# Patient Record
Sex: Male | Born: 1965
Health system: Southern US, Community
[De-identification: ages and names within clinical notes are randomized; demographics above are authoritative.]

## PROBLEM LIST (undated history)

## (undated) DIAGNOSIS — E119 Type 2 diabetes mellitus without complications: Secondary | ICD-10-CM

## (undated) DIAGNOSIS — Z9109 Other allergy status, other than to drugs and biological substances: Secondary | ICD-10-CM

## (undated) DIAGNOSIS — T7840XA Allergy, unspecified, initial encounter: Secondary | ICD-10-CM

## (undated) DIAGNOSIS — I10 Essential (primary) hypertension: Secondary | ICD-10-CM

## (undated) DIAGNOSIS — T884XXA Failed or difficult intubation, initial encounter: Secondary | ICD-10-CM

## (undated) DIAGNOSIS — G4730 Sleep apnea, unspecified: Secondary | ICD-10-CM

## (undated) DIAGNOSIS — J329 Chronic sinusitis, unspecified: Secondary | ICD-10-CM

## (undated) DIAGNOSIS — T8859XA Other complications of anesthesia, initial encounter: Secondary | ICD-10-CM

## (undated) HISTORY — DX: Allergy, unspecified, initial encounter: T78.40XA

## (undated) HISTORY — PX: NO PAST SURGERIES: SHX2092

---

## 1998-09-22 ENCOUNTER — Emergency Department (HOSPITAL_COMMUNITY): Admission: EM | Admit: 1998-09-22 | Discharge: 1998-09-22 | Payer: Self-pay | Admitting: Emergency Medicine

## 1998-11-18 ENCOUNTER — Emergency Department (HOSPITAL_COMMUNITY): Admission: EM | Admit: 1998-11-18 | Discharge: 1998-11-18 | Payer: Self-pay | Admitting: Emergency Medicine

## 1999-02-24 ENCOUNTER — Encounter: Admission: RE | Admit: 1999-02-24 | Discharge: 1999-02-24 | Payer: Self-pay | Admitting: Sports Medicine

## 1999-03-18 ENCOUNTER — Encounter: Admission: RE | Admit: 1999-03-18 | Discharge: 1999-03-18 | Payer: Self-pay | Admitting: Family Medicine

## 2002-01-19 ENCOUNTER — Emergency Department (HOSPITAL_COMMUNITY): Admission: EM | Admit: 2002-01-19 | Discharge: 2002-01-19 | Payer: Self-pay | Admitting: Emergency Medicine

## 2009-05-02 ENCOUNTER — Emergency Department (HOSPITAL_COMMUNITY): Admission: EM | Admit: 2009-05-02 | Discharge: 2009-05-03 | Payer: Self-pay | Admitting: Emergency Medicine

## 2009-12-20 ENCOUNTER — Emergency Department (HOSPITAL_COMMUNITY): Admission: EM | Admit: 2009-12-20 | Discharge: 2009-12-20 | Payer: Self-pay | Admitting: Family Medicine

## 2013-09-28 ENCOUNTER — Emergency Department (HOSPITAL_COMMUNITY)
Admission: EM | Admit: 2013-09-28 | Discharge: 2013-09-28 | Disposition: A | Discharge status: Home / Self Care | Payer: Self-pay | Attending: Emergency Medicine | Admitting: Emergency Medicine

## 2013-09-28 ENCOUNTER — Encounter (HOSPITAL_COMMUNITY): Payer: Self-pay | Admitting: Emergency Medicine

## 2013-09-28 ENCOUNTER — Emergency Department (HOSPITAL_COMMUNITY): Payer: Self-pay

## 2013-09-28 DIAGNOSIS — M549 Dorsalgia, unspecified: Secondary | ICD-10-CM

## 2013-09-28 DIAGNOSIS — J189 Pneumonia, unspecified organism: Secondary | ICD-10-CM

## 2013-09-28 DIAGNOSIS — R059 Cough, unspecified: Secondary | ICD-10-CM

## 2013-09-28 DIAGNOSIS — F17200 Nicotine dependence, unspecified, uncomplicated: Secondary | ICD-10-CM | POA: Insufficient documentation

## 2013-09-28 DIAGNOSIS — J159 Unspecified bacterial pneumonia: Secondary | ICD-10-CM | POA: Insufficient documentation

## 2013-09-28 DIAGNOSIS — R05 Cough: Secondary | ICD-10-CM

## 2013-09-28 DIAGNOSIS — Z792 Long term (current) use of antibiotics: Secondary | ICD-10-CM | POA: Insufficient documentation

## 2013-09-28 HISTORY — DX: Chronic sinusitis, unspecified: J32.9

## 2013-09-28 HISTORY — DX: Other allergy status, other than to drugs and biological substances: Z91.09

## 2013-09-28 MED ORDER — ALBUTEROL SULFATE HFA 108 (90 BASE) MCG/ACT IN AERS
2.0000 | INHALATION_SPRAY | Freq: Once | RESPIRATORY_TRACT | Status: AC
  Administered 2013-09-28: 2 via RESPIRATORY_TRACT
  Filled 2013-09-28: qty 6.7

## 2013-09-28 MED ORDER — AZITHROMYCIN 250 MG PO TABS: 1000.0000 mg | ORAL_TABLET | Freq: Once | ORAL | Status: DC

## 2013-09-28 MED ORDER — AZITHROMYCIN 250 MG PO TABS
500.0000 mg | ORAL_TABLET | Freq: Once | ORAL | Status: AC
  Administered 2013-09-28: 500 mg via ORAL
  Filled 2013-09-28: qty 2

## 2013-09-28 MED ORDER — AZITHROMYCIN 250 MG PO TABS: 250.0000 mg | ORAL_TABLET | Freq: Every day | ORAL | Status: DC

## 2013-09-28 MED ORDER — HYDROCOD POLST-CHLORPHEN POLST 10-8 MG/5ML PO LQCR: 5.0000 mL | Freq: Two times a day (BID) | ORAL | Status: DC | PRN

## 2013-09-28 NOTE — Discharge Instructions (Signed)
Please read and follow all provided instructions.  Your diagnoses today include:  1. Community acquired pneumonia   2. Cough   3. Back pain    Tests performed today include:  Chest x-ray -- shows pneumonia  Vital signs. See below for your results today.   Medications prescribed:   Azithromycin - antibiotic for respiratory infection  You have been prescribed an antibiotic medicine: take the entire course of medicine even if you are feeling better. Stopping early can cause the antibiotic not to work.   Albuterol inhaler - medication that opens up your airway  Use inhaler as follows: 1-2 puffs with spacer every 4 hours as needed for wheezing, cough, or shortness of breath.    Tussinex - narcotic cough suppressant syrup  You have been prescribed narcotic cough suppressant such as Tussinex: DO NOT drive or perform any activities that require you to be awake and alert because this medicine can make you drowsy.   Take any prescribed medications only as directed.  Home care instructions:  Follow any educational materials contained in this packet.  Take the complete course of antibiotics that you were prescribed.   BE VERY CAREFUL not to take multiple medicines containing Tylenol (also called acetaminophen). Doing so can lead to an overdose which can damage your liver and cause liver failure and possibly death.   Follow-up instructions: Please follow-up with your primary care provider in the next 3 days for further evaluation of your symptoms and to ensure resolution of your infection.   If you do not have a primary care doctor -- see below for referral information.   Return instructions:   Please return to the Emergency Department if you experience worsening symptoms.   Return immediately with worsening breathing, worsening shortness of breath, or if you feel it is taking you more effort to breathe.   Please return if you have any other emergent concerns.  Additional  Information:  Your vital signs today were: BP 139/72   Pulse 86   Temp(Src) 98.9 F (37.2 C)   Resp 17   Wt 215 lb (97.523 kg)   SpO2 100% If your blood pressure (BP) was elevated above 135/85 this visit, please have this repeated by your doctor within one month. --------------

## 2013-09-28 NOTE — ED Provider Notes (Signed)
CSN: [id number]     Arrival date & time 09/28/13  1303 History   First MD Initiated Contact with Patient 09/28/13 1312    This chart was scribed for Earmon Phoenix, a non-physician practitioner working with Richarda Blade, MD by Denice Bors, ED Scribe. This patient was seen in room TR07C/TR07C and the patient's care was started at 1:36 PM    Chief Complaint  Patient presents with  . Cough     (Consider location/radiation/quality/duration/timing/severity/associated sxs/prior Treatment) The history is provided by the patient. No language interpreter was used.   HPI Comments: Jorge [name] is a 48 y.o. male who presents to the Emergency Department complaining of persistent productive cough with phelgm onset 3 weeks. 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 Reports associated intermittent left sided back pain with cough, wheeze, mildly bloody nasal discharge, shortness of breath, and rhinorrhea. Reports trying Mucinex, Claritin, Afrin, and Aleve with no relief of symptoms. Reports back pain is exacerbated by cough, and deep breathing. Denies associated fever, and hemoptysis. Reports PMHx seasonal allergies. States he smokes cigarettes. Denies other pertinent PMHx.   Past Medical History  Diagnosis Date  . Environmental allergies   . Sinus infection    History reviewed. No pertinent past surgical history. History reviewed. No pertinent family history. History  Substance Use Topics  . Smoking status: Current Every Day Smoker    Types: Cigarettes  . Smokeless tobacco: Not on file  . Alcohol Use: Yes    Review of Systems  Constitutional: Negative for fever.  HENT: Positive for congestion. Negative for rhinorrhea and sore throat.   Eyes: Negative for redness.  Respiratory: Positive for cough, shortness of breath and wheezing.   Cardiovascular: Negative for chest pain.  Gastrointestinal: Negative for nausea, vomiting, abdominal pain and diarrhea.  Genitourinary: Negative for  dysuria.  Musculoskeletal: Positive for back pain. Negative for myalgias.  Skin: Negative for rash.  Allergic/Immunologic: Positive for environmental allergies.  Neurological: Negative for headaches.  Psychiatric/Behavioral: Negative for confusion.    Allergies  Review of patient's allergies indicates no known allergies.  Home Medications   Current Outpatient Rx  Name  Route  Sig  Dispense  Refill  . azithromycin (ZITHROMAX) 250 MG tablet   Oral   Take 1 tablet (250 mg total) by mouth daily.   4 tablet   0   . chlorpheniramine-HYDROcodone (TUSSIONEX PENNKINETIC ER) 10-8 MG/5ML LQCR   Oral   Take 5 mLs by mouth every 12 (twelve) hours as needed for cough.   115 mL   0    BP 120/68  Pulse 102  Temp(Src) 98.9 F (37.2 C)  Resp 17  Wt 215 lb (97.523 kg)  SpO2 93%  Physical Exam  Nursing note and vitals reviewed. Constitutional: He appears well-developed and well-nourished. No distress.  HENT:  Head: Normocephalic and atraumatic.  Nose: Mucosal edema present.  Mouth/Throat: Uvula is midline, oropharynx is clear and moist and mucous membranes are normal. No trismus in the jaw. No uvula swelling. No oropharyngeal exudate, posterior oropharyngeal edema or posterior oropharyngeal erythema.  Eyes: EOM are normal.  Neck: Neck supple. No tracheal deviation present.  Cardiovascular: Normal rate.   Pulmonary/Chest: Effort normal and breath sounds normal. No respiratory distress. He has no wheezes. He has no rales. He exhibits no tenderness.  Musculoskeletal: Normal range of motion.  Reproducible diffuse left back pain in musculature of back.   No midline C-spine, T-spine, or L-spine tenderness with no step-offs or deformities noted    Neurological:  He is alert.  Skin: Skin is warm and dry.  Psychiatric: He has a normal mood and affect. His behavior is normal.    ED Course  Procedures (including critical care time) COORDINATION OF CARE:  Nursing notes reviewed. Vital  signs reviewed. Initial pt interview and examination performed.   Filed Vitals:   09/28/13 1310  BP: 120/68  Pulse: 102  Temp: 98.9 F (37.2 C)  Resp: 17  Weight: 215 lb (97.523 kg)  SpO2: 93%    1:39 PM-Discussed work up plan with pt at bedside, which includes  Orders Placed This Encounter  Procedures  . DG Chest 2 View    Standing Status: Standing     Number of Occurrences: 1     Standing Expiration Date:     Order Specific Question:  Reason for exam:    Answer:  cough x 3 weeks, SOB  . Pt agrees with plan.   Treatment plan initiated: Medications  albuterol (PROVENTIL HFA;VENTOLIN HFA) 108 (90 BASE) MCG/ACT inhaler 2 puff (2 puffs Inhalation Given 09/28/13 1427)  azithromycin (ZITHROMAX) tablet 500 mg (500 mg Oral Given 09/28/13 1427)   Initial diagnostic testing ordered.    Labs Review Labs Reviewed - No data to display Imaging Review Dg Chest 2 View  09/28/2013   CLINICAL DATA:  Cough for 3 weeks.  EXAM: CHEST  2 VIEW  COMPARISON:  None.  FINDINGS: Heart size is normal. There is density at the right lung base raising the question of infectious infiltrate. No pulmonary edema. Visualized osseous structures have a normal appearance.  IMPRESSION: Question of right lower lobe infiltrate. Followup is recommended to document clearing.   Electronically Signed   By: Shon Hale M.D.   On: 09/28/2013 14:00     EKG Interpretation None      2:27 PM Patient seen and examined. Work-up initiated. Medications ordered.   Vital signs reviewed and are as follows: Filed Vitals:   09/28/13 1321  BP: 139/72  Pulse: 86  Temp:   Resp:   BP 139/72  Pulse 86  Temp(Src) 98.9 F (37.2 C)  Resp 17  Wt 215 lb (97.523 kg)  SpO2 100%  Pt informed of x-ray findings. Infiltrate noted. Azithro and albuterol HFA given in ED. Will d/c to home with azithro and Tussionex.   Encouraged PCP f/u to ensure resolution.   Told to return with high fever, worsening SOB, trouble breathing, severe  pain, other concerns.   Patient counseled on use of narcotic cough medications. Counseled not to combine these medications with others containing tylenol. Urged not to drink alcohol, drive, or perform any other activities that requires focus while taking these medications. The patient verbalizes understanding and agrees with the plan.   MDM   Final diagnoses:  Community acquired pneumonia  Cough  Back pain   Patient with cough x 3 weeks: ? Viral syndrome followed by bacterial infection. No resp distress. No fever. He looks well. Do not suspect PE. Without fever and murmur do not suspect endocarditis. No multilobar infiltrate, no peripheral signs. No effusion concerning for empyema. No indication for chest CT at this time.   Suspect L back pain is due to muscular tenderness from coughing.   I personally performed the services described in this documentation, which was scribed in my presence. The recorded information has been reviewed and is accurate.     Carlisle Cater, PA-C 09/28/13 1704

## 2013-09-28 NOTE — ED Notes (Signed)
Pt states hes had seasonal allergies and a cough for the past 3 weeks. States his cough will not go away and now his back hurts every time he coughs.

## 2013-10-01 ENCOUNTER — Encounter (HOSPITAL_COMMUNITY): Payer: Self-pay | Admitting: Emergency Medicine

## 2013-10-01 ENCOUNTER — Emergency Department (HOSPITAL_COMMUNITY)
Admission: EM | Admit: 2013-10-01 | Discharge: 2013-10-01 | Disposition: A | Discharge status: Home / Self Care | Payer: Self-pay | Attending: Emergency Medicine | Admitting: Emergency Medicine

## 2013-10-01 ENCOUNTER — Emergency Department (HOSPITAL_COMMUNITY): Payer: Self-pay

## 2013-10-01 DIAGNOSIS — R059 Cough, unspecified: Secondary | ICD-10-CM | POA: Insufficient documentation

## 2013-10-01 DIAGNOSIS — Y9389 Activity, other specified: Secondary | ICD-10-CM | POA: Insufficient documentation

## 2013-10-01 DIAGNOSIS — F17200 Nicotine dependence, unspecified, uncomplicated: Secondary | ICD-10-CM | POA: Insufficient documentation

## 2013-10-01 DIAGNOSIS — R05 Cough: Secondary | ICD-10-CM | POA: Insufficient documentation

## 2013-10-01 DIAGNOSIS — Y929 Unspecified place or not applicable: Secondary | ICD-10-CM | POA: Insufficient documentation

## 2013-10-01 DIAGNOSIS — S2239XA Fracture of one rib, unspecified side, initial encounter for closed fracture: Secondary | ICD-10-CM | POA: Insufficient documentation

## 2013-10-01 DIAGNOSIS — Z79899 Other long term (current) drug therapy: Secondary | ICD-10-CM | POA: Insufficient documentation

## 2013-10-01 DIAGNOSIS — Z791 Long term (current) use of non-steroidal anti-inflammatories (NSAID): Secondary | ICD-10-CM | POA: Insufficient documentation

## 2013-10-01 DIAGNOSIS — Z8709 Personal history of other diseases of the respiratory system: Secondary | ICD-10-CM | POA: Insufficient documentation

## 2013-10-01 DIAGNOSIS — X58XXXA Exposure to other specified factors, initial encounter: Secondary | ICD-10-CM | POA: Insufficient documentation

## 2013-10-01 MED ORDER — GUAIFENESIN 100 MG/5ML PO SYRP: 100.0000 mg | ORAL_SOLUTION | ORAL | Status: DC | PRN

## 2013-10-01 MED ORDER — OXYCODONE-ACETAMINOPHEN 5-325 MG PO TABS
1.0000 | ORAL_TABLET | ORAL | Status: DC | PRN
Start: 2013-10-01 — End: 2016-08-12

## 2013-10-01 NOTE — ED Notes (Signed)
MD at bedside. 

## 2013-10-01 NOTE — ED Provider Notes (Signed)
I saw and evaluated the patient, reviewed the resident's note and I agree with the findings and plan.   EKG Interpretation None      No results found for this or any previous visit. Dg Chest 2 View  09/28/2013   CLINICAL DATA:  Cough for 3 weeks.  EXAM: CHEST  2 VIEW  COMPARISON:  None.  FINDINGS: Heart size is normal. There is density at the right lung base raising the question of infectious infiltrate. No pulmonary edema. Visualized osseous structures have a normal appearance.  IMPRESSION: Question of right lower lobe infiltrate. Followup is recommended to document clearing.   Electronically Signed   By: Shon Hale M.D.   On: 09/28/2013 14:00   Dg Ribs Unilateral W/chest Left  10/01/2013   CLINICAL DATA:  Coughing.  Pneumonia.  EXAM: LEFT RIBS AND CHEST - 3+ VIEW  COMPARISON:  DG CHEST 2 VIEW dated 09/28/2013  FINDINGS: There is a new small left pleural effusion. Opacity at the left lung base may represent compressive atelectasis however airspace disease cannot be excluded. Patchy opacity at the right lung base appears similar, which may represent treated pneumonia or post infectious/ inflammatory changes.  There is a displaced Left tenth rib fracture.  No pneumothorax.  IMPRESSION: 1. Displaced left tenth rib fracture. No pneumothorax. Small left pleural effusion and left basilar density likely representing atelectasis. 2. Little change in patchy airspace density at the right base, which may represent treated pneumonia.   Electronically Signed   By: Dereck Ligas M.D.   On: 10/01/2013 13:57    Patient seen by me. Agree with the resident's plan. Patient diagnosed with pneumonia finished course of the Zithromax today. Chest x-ray today shows improvement in the pneumonia however he has best we can tell new left 10th rib fracture and that's where most of his pain is it is on the left side it is posterior. Patient in no acute distress. Patient is pain control. Patient given per cautioned Z. if fevers  recur shortness of breath gets much worse to come on back. Suspect a rib fracture occurred from coughing. Because is no history of trauma.     Mervin Kung, MD 10/01/13 [phone]

## 2013-10-01 NOTE — ED Notes (Signed)
Was dx w/ pneu last week now he feels like  His  hurts when he coughs and hurts his back . Took last antibiotic today  Has  48 yo w/ him, drove himself to hospital

## 2013-10-01 NOTE — ED Provider Notes (Signed)
CSN: [id number]     Arrival date & time 10/01/13  1309 History   None    No chief complaint on file.  HPI Jorge [name] is a 48 y.o. male presents complaining of rib pain. Cough for 3-4 weeks.  Recently treated with azithromycin for PNA.  Improving, but still with cough.  No sputum production now.  No fever now.  No systemic symtpoms.  Now with worse pain from coughing in his abdomen and left posterolateral chest wall.  Pain is severe.  He cannot afford cough medicine previously prescribed.  Symptoms were initially relieved by cough medicine.  Partially relieved by Motrin.  No frontal chest pain, shorntness of breath, hemoptysis, leg swelling, or other symptoms.  Past Medical History  Diagnosis Date  . Environmental allergies   . Sinus infection    History reviewed. No pertinent past surgical history. No family history on file. History  Substance Use Topics  . Smoking status: Current Every Day Smoker    Types: Cigarettes  . Smokeless tobacco: Not on file  . Alcohol Use: Yes    Review of Systems  Constitutional: Negative for fever and chills.  HENT: Negative for congestion and rhinorrhea.   Eyes: Negative for visual disturbance.  Respiratory: Positive for cough. Negative for shortness of breath.   Cardiovascular: Negative for chest pain and leg swelling.  Gastrointestinal: Negative for nausea, vomiting, abdominal pain and diarrhea.  Genitourinary: Negative for dysuria, urgency, frequency, flank pain and difficulty urinating.  Musculoskeletal: Negative for back pain, neck pain and neck stiffness.  Skin: Negative for [name].  Neurological: Negative for syncope, weakness, numbness and headaches.  All other systems reviewed and are negative.     Allergies  Review of patient's allergies indicates no known allergies.  Home Medications   Current Outpatient Rx  Name  Route  Sig  Dispense  Refill  . albuterol (PROVENTIL HFA;VENTOLIN HFA) 108 (90 BASE) MCG/ACT inhaler    Inhalation   Inhale 2 puffs into the lungs every 6 (six) hours as needed for wheezing or shortness of breath.         Marland Kitchen azithromycin (ZITHROMAX) 250 MG tablet   Oral   Take 250 mg by mouth daily. For 3 days. Finished on 10-01-13         . chlorpheniramine-HYDROcodone (TUSSIONEX PENNKINETIC ER) 10-8 MG/5ML LQCR   Oral   Take 5 mLs by mouth every 12 (twelve) hours as needed for cough.   115 mL   0   . naproxen sodium (ALEVE) 220 MG tablet   Oral   Take 220 mg by mouth 2 (two) times daily with a meal.         . guaifenesin (ROBITUSSIN) 100 MG/5ML syrup   Oral   Take 5-10 mLs (100-200 mg total) by mouth every 4 (four) hours as needed for cough.   60 mL   0   . oxyCODONE-acetaminophen (PERCOCET/ROXICET) 5-325 MG per tablet   Oral   Take 1-2 tablets by mouth every 4 (four) hours as needed for severe pain.   30 tablet   0    BP 151/89  Pulse 74  Temp(Src) 97.8 F (36.6 C) (Oral)  Resp 16  SpO2 98% Physical Exam  Nursing note and vitals reviewed. Constitutional: He is oriented to person, place, and time. He appears well-developed and well-nourished. No distress.  Looks well, but winces with cough  HENT:  Head: Normocephalic and atraumatic.  Mouth/Throat: Oropharynx is clear and moist.  Eyes: Conjunctivae and EOM are  normal. Pupils are equal, round, and reactive to light. No scleral icterus.  Neck: Normal range of motion. Neck supple. No JVD present. No tracheal deviation present.  Cardiovascular: Normal rate, regular rhythm, normal heart sounds and intact distal pulses.  Exam reveals no gallop and no friction rub.   No murmur heard. Pulmonary/Chest: Effort normal and breath sounds normal. No stridor. No respiratory distress. He has no wheezes. He has no rales. He exhibits tenderness (tenderness to palpation bilaterally over lower costal margins and over left posteralateral chesst wall focally).  Abdominal: Soft. He exhibits no distension and no mass. There is no  tenderness. There is no rebound and no guarding.  Musculoskeletal: He exhibits no edema.  Neurological: He is alert and oriented to person, place, and time. No cranial nerve deficit. He exhibits normal muscle tone.  Skin: Skin is warm. No [name] noted. He is not diaphoretic.  Psychiatric: He has a normal mood and affect.    ED Course  Procedures (including critical care time) Labs Review Labs Reviewed  BORDETELLA PERTUSSIS PCR   Imaging Review Dg Ribs Unilateral W/chest Left  10/01/2013   CLINICAL DATA:  Coughing.  Pneumonia.  EXAM: LEFT RIBS AND CHEST - 3+ VIEW  COMPARISON:  DG CHEST 2 VIEW dated 09/28/2013  FINDINGS: There is a new small left pleural effusion. Opacity at the left lung base may represent compressive atelectasis however airspace disease cannot be excluded. Patchy opacity at the right lung base appears similar, which may represent treated pneumonia or post infectious/ inflammatory changes.  There is a displaced Left tenth rib fracture.  No pneumothorax.  IMPRESSION: 1. Displaced left tenth rib fracture. No pneumothorax. Small left pleural effusion and left basilar density likely representing atelectasis. 2. Little change in patchy airspace density at the right base, which may represent treated pneumonia.   Electronically Signed   Deirdre Peer and and and a Dereck Ligas M.D.   On: 10/01/2013 13:57     EKG Interpretation None      MDM   48 yo M with 3 weeks of cough.  Recently treated with azithromycin for PNA.  Improved.  Now with no fever, chills, or sputum; residual cough, but better.  Significant pain with cough, particularly lower left chest wall.  XR demonstrates broken rib.  I do not suspect PE.  This is non-cardiac pain.  Plan: - Percocet for broken rib - incentive spirometer - robitussin - test for pertussis - narcotic precautions - work note - do not think PE is likely; testing not warranted; we have a clear explanation - chest clear; long time smoker, but no  evidence of bronchospasm; continue albuterol prn  Final diagnoses:  Cough  Broken rib       Wendall Papa, MD 10/03/13 1330

## 2013-10-01 NOTE — ED Notes (Signed)
Family at bedside. 

## 2013-10-04 NOTE — ED Provider Notes (Signed)
Medical screening examination/treatment/procedure(s) were performed by non-physician practitioner and as supervising physician I was immediately available for consultation/collaboration.  Richarda Blade, MD 10/04/13 [phone]

## 2014-10-01 ENCOUNTER — Emergency Department (HOSPITAL_COMMUNITY)
Admission: EM | Admit: 2014-10-01 | Discharge: 2014-10-01 | Disposition: A | Discharge status: Home / Self Care | Payer: Self-pay | Attending: Emergency Medicine | Admitting: Emergency Medicine

## 2014-10-01 ENCOUNTER — Encounter (HOSPITAL_COMMUNITY): Payer: Self-pay

## 2014-10-01 ENCOUNTER — Emergency Department (HOSPITAL_COMMUNITY): Payer: Self-pay

## 2014-10-01 DIAGNOSIS — J209 Acute bronchitis, unspecified: Secondary | ICD-10-CM | POA: Insufficient documentation

## 2014-10-01 DIAGNOSIS — Z720 Tobacco use: Secondary | ICD-10-CM | POA: Insufficient documentation

## 2014-10-01 DIAGNOSIS — Z791 Long term (current) use of non-steroidal anti-inflammatories (NSAID): Secondary | ICD-10-CM | POA: Insufficient documentation

## 2014-10-01 DIAGNOSIS — Z79899 Other long term (current) drug therapy: Secondary | ICD-10-CM | POA: Insufficient documentation

## 2014-10-01 LAB — CBC WITH DIFFERENTIAL/PLATELET
Basophils Absolute: 0 10*3/uL (ref 0.0–0.1)
Basophils Relative: 0 % (ref 0–1)
Eosinophils Absolute: 0.2 10*3/uL (ref 0.0–0.7)
Eosinophils Relative: 2 % (ref 0–5)
HCT: 42.1 % (ref 39.0–52.0)
Hemoglobin: 13.7 g/dL (ref 13.0–17.0)
Lymphocytes Relative: 26 % (ref 12–46)
Lymphs Abs: 3.6 10*3/uL (ref 0.7–4.0)
MCH: 29.6 pg (ref 26.0–34.0)
MCHC: 32.5 g/dL (ref 30.0–36.0)
MCV: 90.9 fL (ref 78.0–100.0)
Monocytes Absolute: 1 10*3/uL (ref 0.1–1.0)
Monocytes Relative: 8 % (ref 3–12)
Neutro Abs: 8.8 10*3/uL — ABNORMAL HIGH (ref 1.7–7.7)
Neutrophils Relative %: 64 % (ref 43–77)
Platelets: 365 10*3/uL (ref 150–400)
RBC: 4.63 MIL/uL (ref 4.22–5.81)
RDW: 15 % (ref 11.5–15.5)
WBC: 13.6 10*3/uL — ABNORMAL HIGH (ref 4.0–10.5)

## 2014-10-01 LAB — BASIC METABOLIC PANEL
Anion gap: 7 (ref 5–15)
BUN: 9 mg/dL (ref 6–23)
CO2: 29 mmol/L (ref 19–32)
Calcium: 9.2 mg/dL (ref 8.4–10.5)
Chloride: 106 mmol/L (ref 96–112)
Creatinine, Ser: 1.19 mg/dL (ref 0.50–1.35)
GFR calc Af Amer: 82 mL/min — ABNORMAL LOW (ref >90)
GFR calc non Af Amer: 71 mL/min — ABNORMAL LOW (ref >90)
Glucose, Bld: 117 mg/dL — ABNORMAL HIGH (ref 70–99)
Potassium: 3.9 mmol/L (ref 3.5–5.1)
Sodium: 142 mmol/L (ref 135–145)

## 2014-10-01 LAB — I-STAT TROPONIN, ED: Troponin i, poc: 0 ng/mL (ref 0.00–0.08)

## 2014-10-01 LAB — D-DIMER, QUANTITATIVE (NOT AT ARMC): D-Dimer, Quant: 0.27 ug/mL-FEU (ref 0.00–0.48)

## 2014-10-01 MED ORDER — HYDROCODONE-HOMATROPINE 5-1.5 MG/5ML PO SYRP
5.0000 mL | ORAL_SOLUTION | Freq: Once | ORAL | Status: AC
  Administered 2014-10-01: 5 mL via ORAL
  Filled 2014-10-01: qty 5

## 2014-10-01 MED ORDER — HYDROCODONE-HOMATROPINE 5-1.5 MG/5ML PO SYRP: 5.0000 mL | ORAL_SOLUTION | Freq: Four times a day (QID) | ORAL | Status: DC | PRN

## 2014-10-01 MED ORDER — IPRATROPIUM-ALBUTEROL 0.5-2.5 (3) MG/3ML IN SOLN
3.0000 mL | Freq: Once | RESPIRATORY_TRACT | Status: AC
  Administered 2014-10-01: 3 mL via RESPIRATORY_TRACT
  Filled 2014-10-01: qty 3

## 2014-10-01 MED ORDER — AZITHROMYCIN 250 MG PO TABS: 250.0000 mg | ORAL_TABLET | Freq: Every day | ORAL | Status: DC

## 2014-10-01 MED ORDER — ALBUTEROL SULFATE HFA 108 (90 BASE) MCG/ACT IN AERS: 1.0000 | INHALATION_SPRAY | Freq: Four times a day (QID) | RESPIRATORY_TRACT | Status: DC | PRN

## 2014-10-01 NOTE — ED Notes (Signed)
Onset 2 months cough and left side and back pain when coughing.  Talking in complete sentences, unlabored breathing.  Pt has been taking Mucinex, Tylenol cold and flu, Nyquil with little relief.

## 2014-10-01 NOTE — ED Notes (Signed)
Pt comfortable with discharge and follow up instructions. Pt declines wheelchair, escorted to waiting area by this RN.

## 2014-10-01 NOTE — Discharge Instructions (Signed)
Acute Bronchitis Bronchitis is inflammation of the airways that extend from the windpipe into the lungs (bronchi). The inflammation often causes mucus to develop. This leads to a cough, which is the most common symptom of bronchitis.  In acute bronchitis, the condition usually develops suddenly and goes away over time, usually in a couple weeks. Smoking, allergies, and asthma can make bronchitis worse. Repeated episodes of bronchitis may cause further lung problems.  CAUSES Acute bronchitis is most often caused by the same virus that causes a cold. The virus can spread from person to person (contagious) through coughing, sneezing, and touching contaminated objects. SIGNS AND SYMPTOMS   Cough.   Fever.   Coughing up mucus.   Body aches.   Chest congestion.   Chills.   Shortness of breath.   Sore throat.  DIAGNOSIS  Acute bronchitis is usually diagnosed through a physical exam. Your health care provider will also ask you questions about your medical history. Tests, such as chest X-rays, are sometimes done to rule out other conditions.  TREATMENT  Acute bronchitis usually goes away in a couple weeks. Oftentimes, no medical treatment is necessary. Medicines are sometimes given for relief of fever or cough. Antibiotic medicines are usually not needed but may be prescribed in certain situations. In some cases, an inhaler may be recommended to help reduce shortness of breath and control the cough. A cool mist vaporizer may also be used to help thin bronchial secretions and make it easier to clear the chest.  HOME CARE INSTRUCTIONS  Get plenty of rest.   Drink enough fluids to keep your urine clear or pale yellow (unless you have a medical condition that requires fluid restriction). Increasing fluids may help thin your respiratory secretions (sputum) and reduce chest congestion, and it will prevent dehydration.   Take medicines only as directed by your health care provider.  If  you were prescribed an antibiotic medicine, finish it all even if you start to feel better.  Avoid smoking and secondhand smoke. Exposure to cigarette smoke or irritating chemicals will make bronchitis worse. If you are a smoker, consider using nicotine gum or skin patches to help control withdrawal symptoms. Quitting smoking will help your lungs heal faster.   Reduce the chances of another bout of acute bronchitis by washing your hands frequently, avoiding people with cold symptoms, and trying not to touch your hands to your mouth, nose, or eyes.   Keep all follow-up visits as directed by your health care provider.  SEEK MEDICAL CARE IF: Your symptoms do not improve after 1 week of treatment.  SEEK IMMEDIATE MEDICAL CARE IF:  You develop an increased fever or chills.   You have chest pain.   You have severe shortness of breath.  You have bloody sputum.   You develop dehydration.  You faint or repeatedly feel like you are going to pass out.  You develop repeated vomiting.  You develop a severe headache. MAKE SURE YOU:   Understand these instructions.  Will watch your condition.  Will get help right away if you are not doing well or get worse. Document Released: 07/22/2004 Document Revised: 10/29/2013 Document Reviewed: 12/05/2012 [facility] Patient Information 2015 [place], [place]. This information is not intended to replace advice given to you by your health care provider. Make sure you discuss any questions you have with your health care provider.   Emergency Department Resource Guide 1) Find a Doctor and Pay Out of Pocket Although you won't have to find out who is  covered by your insurance plan, it is a good idea to ask around and get recommendations. You will then need to call the office and see if the doctor you have chosen will accept you as a new patient and what types of options they offer for patients who are self-pay. Some doctors offer discounts or will set up  payment plans for their patients who do not have insurance, but you will need to ask so you aren't surprised when you get to your appointment.  2) Contact Your Local Health Department Not all health departments have doctors that can see patients for sick visits, but many do, so it is worth a call to see if yours does. If you don't know where your local health department is, you can check in your phone book. The CDC also has a tool to help you locate your state's health department, and many state websites also have listings of all of their local health departments.  3) Find a [facility] If your illness is not likely to be very severe or complicated, you may want to try a walk in clinic. These are popping up all over the country in pharmacies, drugstores, and shopping centers. They're usually staffed by nurse practitioners or physician assistants that have been trained to treat common illnesses and complaints. They're usually fairly quick and inexpensive. However, if you have serious medical issues or chronic medical problems, these are probably not your best option.  No Primary Care Doctor: - Call Health Connect at  [phone] - they can help you locate a primary care doctor that  accepts your insurance, provides certain services, etc. - Physician Referral Service- [phone]  Chronic Pain Problems: Organization         Address  Phone   Notes  [facility]  [phone] Patients need to be referred by their primary care doctor.   Medication Assistance: Organization         Address  Phone   Notes  [facility] Medication [facility] [address] [place], [place] [address] [phone] --Must be a resident of [facility] -- Must have NO insurance coverage whatsoever (no Medicaid/ Medicare, etc.) -- The pt. MUST have a primary care doctor that directs their care regularly and follows them in the community   Med[facility]ist  [phone]   [employer]  [phone]    Agencies that provide inexpensive medical care: Organization         Address  Phone   Notes  [place]  [phone]   [facility]    [phone]   [facility] [place], [place] [address] [phone]   [place] [address] [place] [phone]   Planned Parenthood    [phone]   [facility]    [phone]   [place] and [place] [address], [place] Phone:  [phone], Fax:  [phone] Hours of Operation:  9 am - 6 pm, M-F.  Also accepts Medicaid/Medicare and self-pay.  [facility] for [place], [address], [place] Phone: [phone], Fax: [phone]. Hours of Operation:  8:30 am - 5:30 pm, M-F.  Also accepts Medicaid and self-pay.  [facility] [place] [address], [place] Phone: [phone]   [place], [place], [place] [phone], Ext.  123 Mondays & Thursdays: 7-9 AM.  First 15 patients are seen on a first come, first serve basis.    [place] Providers:  Organization         Address  Phone   Notes  [facility] [address], [place] [phone] Also accepts self-pay patients.  [facility] [address], [place]  [phone]   [place], [address], [place] [phone]   [facility] [address], [place] [phone]   Lucianne Lei [address], [place]   [phone] Only accepts [place] Access [place] patients after they have their name applied to their card.   Self-Pay (no insurance) in [facility]:  Organization         Address  Phone   Notes  Sickle Cell Patients, [facility] [place]  [phone]   [facility] [place] [phone]   [facility] [place]  [place], [place], [place] [phone]   [facility]/Dr. Osei-Bonsu  [address] [place] or [address], [place] [phone] Phone number for both [place] and [place] locations is the same.  Urgent Medical and [facility] [address] [place] [phone]   [facility] [address], [place] or [address] [phone]   [facility] [address] [place] [phone], phone; [phone], fax Sees patients 1st and 3rd Saturday of every month.  Must not qualify for public or private insurance (i.e. Medicaid, Medicare, [place] Health Choice, Veterans' Benefits)  Household income should be no more than 200% of the poverty level The clinic cannot treat you if you are pregnant or think you are pregnant  Sexually transmitted diseases are not treated at the clinic.    Dental Care: Organization         Address  Phone  Notes  [facility] [place] [facility] Department of [facility] [phone] Accepts children up to age 47 who are enrolled in [place] or [place]; pregnant women with a Medicaid card; and children who have applied for Medicaid or [place] Health Choice, but were declined, whose parents can pay a reduced fee at time of service.  [facility] Department of [facility]  [address], [place] [phone] Accepts children up to age 70 who are enrolled in [place] or [place]; pregnant women with a Medicaid card; and children who have applied for Medicaid or [place] Health Choice, but were declined, whose parents can pay a reduced fee at time of service.  [place] Adult Dental Access PROGRAM  [place] [phone] Patients  are seen by appointment only. Walk-ins are not accepted. [place] will see patients 47 years of age and older. Monday - Tuesday (8am-5pm) Most Wednesdays (8:30-5pm) $30 per visit, cash only  [facility] Adult Dental Access PROGRAM  [address], [facility] [phone] Patients are seen by appointment only. Walk-ins are not accepted. [place] will see patients 16 years of age and older. One Wednesday Evening (Monthly: Volunteer Based).  $30 per visit, cash only  [place]  [phone] for  adults; Children under age 74, call Graduate Pediatric Dentistry at [phone]. Children aged 20-14, please call [phone] to request a pediatric application.  Dental services are provided in all areas of dental care including fillings, crowns and bridges, complete and partial dentures, implants, gum treatment, root canals, and extractions. Preventive care is also provided. Treatment is provided to both adults and children. Patients are selected via a lottery and there is often a waiting list.   [facility] [address], [place]  [phone] [email or web address]   [facility] [address] [place], [place] [phone], Ext. [phone] Second and Fourth Thursday of each month, opens at 6:30 AM; Clinic ends at 9 AM.  Patients are seen on a first-come first-served basis, and a limited number are seen during each clinic.   [facility]  [address] Hillard Danker [place], [place] [phone]   Eligibility Requirements You must have lived in [place], [place], or [place] counties for at least the last three months.   You cannot be eligible for state or federal sponsored [employer], including [employer], [place], or [employer].   You generally cannot be eligible for healthcare insurance through your employer.    How to apply: Eligibility screenings are held every Tuesday and Wednesday afternoon from 1:00 pm until  4:00 pm. You do not need an appointment for the interview!  [facility] [address] [place], [place]   [place]  Seminary Department  [place]  [phone]    Behavioral Health Resources in the Community: Intensive Outpatient Programs Organization         Address  Phone  Notes  [place]. [address], [place], [place] [phone]   [facility] Outpatient [address], [place], [place]   ADS: Alcohol & Drug Svcs [address], [place], [place]   [place] [address],  [place], [place] or [phone]   Substance Abuse Resources Organization         Address  Phone  Notes  Alcohol and Drug Services  [phone]   [place]  [phone]   [facility]   Chinita Pester  [phone]   Residential & Outpatient Substance Abuse Program  [phone]   Psychological Services Organization         Address  Phone  Notes  [facility] [place]  [place]  [phone]   [place] [address] [place] or [phone]    Mobile Crisis Teams Organization         Address  Phone  Notes  [facility], Mobile Crisis Care Unit  [phone]   [facility]  [address] [place], [place]   [place] Levels [address] [place] [phone]    Self-Help/Support Groups Organization         Address  Phone             Notes  [place]. of [place] - variety of support groups  [place] Call for more information  Narcotics Anonymous (NA), Caring Services [address], [employer] [place]  2 meetings at this location   Brewing technologist  Notes  ASAP  Residential Treatment [address],    [place]  [phone]   [facility]  [address], [place] [phone], [place], [place]   [place], [place] [phone] Admissions: 8am-3pm M-F  Incentives Substance [facility] [address]    [place], [place] [phone]   [facility] [address] [place], [place], [place]   [facility] [address]  [place], [place]   [facility]ight Programs - Intensive Outpatient [address] Kristeen [address], [place], [place] Headquarters   [facility] ([place]  [place], [place] [phone] or [phone]   [facility]idential Treatment Services (RTS) [address], [place], [place] Accepts Medicaid  [facility] [place] [address],  [place] [phone] Substance Abuse/Addiction Treatment   [facility] Organization         Address  Phone  Notes  [facility]  [phone]   Domenic Schwab, PhD [address] Arlis [place], [place]   [phone] or [phone]   [place], [place] [phone]   [facility] [address], [place], [place] [phone] Insurance/Medicaid/sponsorship through [facility] and Families [address], [address]                                    [place], [place] [phone] [place] [address], [place] [phone]    Dr. Adele Schilder  [phone]   [facility] Dept. 1) [address] [place] 2) [place] 3)  [address], [place] [phone]  [phone]   [place] [phone] or [phone] (After Hours)

## 2014-10-01 NOTE — ED Provider Notes (Signed)
CSN: [id number]     Arrival date & time 10/01/14  1138 History   First MD Initiated Contact with Patient 10/01/14 1328     Chief Complaint  Patient presents with  . Cough  . Back Pain     (Consider location/radiation/quality/duration/timing/severity/associated sxs/prior Treatment) HPI Apollos Carden is a 49 year old male with no significant past medical history who presents to the ER complaint cough. Patient reports having flulike symptoms a week and a half ago, and stated he has since had a persistent cough. Patient states his cough is mildly productive with some white/clear colored sputum on occasion. Patient reports associated shortness of breath which she describes as being short of breath when he begins to run the bases around his son's baseball game. Patient also describes an associated left lower posterior chest wall pain which she states is consistent with the area where he fractured a rib last year from coughing secondary to pneumonia. He states he does not believe he has refractured rib, however states there is still some soreness in this area when he coughs. Patient denies shortness of breath at rest.  Patient denies associated fever, chest pain, dizziness, headache, weakness, blurred vision, nausea, vomiting, abdominal pain, dysuria.  Past Medical History  Diagnosis Date  . Environmental allergies   . Sinus infection    History reviewed. No pertinent past surgical history. History reviewed. No pertinent family history. History  Substance Use Topics  . Smoking status: Current Every Day Smoker -- 0.25 packs/day    Types: Cigarettes  . Smokeless tobacco: Not on file  . Alcohol Use: No    Review of Systems  Constitutional: Negative for fever.  HENT: Negative for sore throat and trouble swallowing.   Eyes: Negative for visual disturbance.  Respiratory: Positive for cough. Negative for shortness of breath.   Cardiovascular: Negative for chest pain.  Gastrointestinal:  Negative for nausea, vomiting and abdominal pain.  Genitourinary: Negative for dysuria.  Musculoskeletal: Negative for neck pain.  Skin: Negative for rash.  Neurological: Negative for dizziness, weakness and numbness.  Psychiatric/Behavioral: Negative.       Allergies  Review of patient's allergies indicates no known allergies.  Home Medications   Prior to Admission medications   Medication Sig Start Date End Date Taking? Authorizing Provider  naproxen sodium (ALEVE) 220 MG tablet Take 220 mg by mouth 2 (two) times daily with a meal.   Yes Historical Provider, MD  oxymetazoline (AFRIN) 0.05 % nasal spray Place 1 spray into both nostrils 2 (two) times daily.   Yes Historical Provider, MD  albuterol (PROVENTIL HFA;VENTOLIN HFA) 108 (90 BASE) MCG/ACT inhaler Inhale 1-2 puffs into the lungs every 6 (six) hours as needed for wheezing or shortness of breath. 10/01/14   Dahlia Bailiff, PA-C  azithromycin (ZITHROMAX) 250 MG tablet Take 1 tablet (250 mg total) by mouth daily. Take first 2 tablets together, then 1 every day until finished. 10/01/14   Dahlia Bailiff, PA-C  chlorpheniramine-HYDROcodone (TUSSIONEX PENNKINETIC ER) 10-8 MG/5ML LQCR Take 5 mLs by mouth every 12 (twelve) hours as needed for cough. Patient not taking: Reported on 10/01/2014 09/28/13   Carlisle Cater, PA-C  guaifenesin (ROBITUSSIN) 100 MG/5ML syrup Take 5-10 mLs (100-200 mg total) by mouth every 4 (four) hours as needed for cough. Patient not taking: Reported on 10/01/2014 10/01/13   Wendall Papa, MD  HYDROcodone-homatropine [facility]) 5-1.5 MG/5ML syrup Take 5 mLs by mouth every 6 (six) hours as needed for cough. 10/01/14   Dahlia Bailiff, PA-C  oxyCODONE-acetaminophen (PERCOCET/ROXICET) 5-325 MG  per tablet Take 1-2 tablets by mouth every 4 (four) hours as needed for severe pain. Patient not taking: Reported on 10/01/2014 10/01/13   Wendall Papa, MD   BP 143/76 mmHg  Pulse 103  Temp(Src) 98.5 F (36.9 C) (Oral)  Resp 13  Ht 5\' 4"  (1.626 m)  Wt 220  lb (99.791 kg)  BMI 37.74 kg/m2  SpO2 96% Physical Exam  Constitutional: He is oriented to person, place, and time. He appears well-developed and well-nourished. No distress.  HENT:  Head: Normocephalic and atraumatic.  Mouth/Throat: Oropharynx is clear and moist. No oropharyngeal exudate.  Eyes: Pupils are equal, round, and reactive to light. Right eye exhibits no discharge. Left eye exhibits no discharge. No scleral icterus.  Neck: Normal range of motion.  Cardiovascular: Normal rate, regular rhythm, S1 normal, S2 normal and normal heart sounds.   No murmur heard. Pulmonary/Chest: Effort normal and breath sounds normal. No accessory muscle usage. No tachypnea. No respiratory distress.  Abdominal: Soft. There is no tenderness. There is no rigidity, no guarding, no tenderness at McBurney's point and negative Murphy's sign.  Musculoskeletal: Normal range of motion. He exhibits no edema or tenderness.  Neurological: He is alert and oriented to person, place, and time. No cranial nerve deficit. Coordination normal. GCS eye subscore is 4. GCS verbal subscore is 5. GCS motor subscore is 6.  Skin: Skin is warm and dry. No rash noted. He is not diaphoretic.  Psychiatric: He has a normal mood and affect.  Nursing note and vitals reviewed.   ED Course  Procedures (including critical care time) Labs Review Labs Reviewed  BASIC METABOLIC PANEL - Abnormal; Notable for the following:    Glucose, Bld 117 (*)    GFR calc non Af Amer 71 (*)    GFR calc Af Amer 82 (*)    All other components within normal limits  CBC WITH DIFFERENTIAL/PLATELET - Abnormal; Notable for the following:    WBC 13.6 (*)    Neutro Abs 8.8 (*)    All other components within normal limits  D-DIMER, QUANTITATIVE  I-STAT TROPOININ, ED    Imaging Review Dg Chest 2 View  10/01/2014   CLINICAL DATA:  Cough and fever.  EXAM: CHEST  2 VIEW  COMPARISON:  October 01, 2013.  FINDINGS: The heart size and mediastinal contours are  within normal limits. Both lungs are clear. No pneumothorax or pleural effusion is noted. Old left rib fractures are noted.  IMPRESSION: No active cardiopulmonary disease.   Electronically Signed   By: Marijo Conception, M.D.   On: 10/01/2014 13:52     EKG Interpretation None      MDM   Final diagnoses:  Acute bronchitis, unspecified organism    Pt CXR negative for acute infiltrate. Patient's cough was consistent with an acute bronchitis, patient has normal EKG without evidence of injury or ectopy. Patient's symptoms improved while in the ED. Labs unremarkable for acute pathology. D-dimer negative, no concern for PE. Troponin negative. No concern for ACS. With patient being a smoker, possibility for patient having an atypical pneumonia, we'll cover with azithromycin. We'll discharge at this time, provides symptomatically therapy for acute bronchitis. Discussed return precautions with patient, and referred patient to wellness clinic as well as gave outpatient resources to follow with a primary care provider. Patient verbalizes understanding and agreement of this plan. I encouraged patient to call or return to ER with any worsening symptoms or should he have any questions or concerns.  BP 143/76  mmHg  Pulse 103  Temp(Src) 98.5 F (36.9 C) (Oral)  Resp 13  Ht 5\' 4"  (1.626 m)  Wt 220 lb (99.791 kg)  BMI 37.74 kg/m2  SpO2 96%  Signed,  Dahlia Bailiff, PA-C 5:49 PM  Patient seen and discussed with Dr. Charlesetta Shanks, M.D.   Dahlia Bailiff, PA-C 10/01/14 1749  Charlesetta Shanks, MD 10/01/14 1754

## 2014-11-15 ENCOUNTER — Ambulatory Visit: Payer: Self-pay | Attending: Internal Medicine

## 2016-02-25 ENCOUNTER — Ambulatory Visit: Payer: Self-pay | Attending: Internal Medicine

## 2016-03-04 ENCOUNTER — Ambulatory Visit: Payer: Self-pay | Admitting: Family Medicine

## 2016-03-15 ENCOUNTER — Ambulatory Visit: Payer: Self-pay | Attending: Family Medicine | Admitting: Internal Medicine

## 2016-03-15 ENCOUNTER — Encounter: Payer: Self-pay | Admitting: Family Medicine

## 2016-03-15 VITALS — BP 138/95 | HR 93 | Temp 98.2°F | Wt 221.4 lb

## 2016-03-15 DIAGNOSIS — R400 Somnolence: Secondary | ICD-10-CM | POA: Insufficient documentation

## 2016-03-15 DIAGNOSIS — F17210 Nicotine dependence, cigarettes, uncomplicated: Secondary | ICD-10-CM | POA: Insufficient documentation

## 2016-03-15 DIAGNOSIS — Z131 Encounter for screening for diabetes mellitus: Secondary | ICD-10-CM

## 2016-03-15 DIAGNOSIS — G4710 Hypersomnia, unspecified: Secondary | ICD-10-CM

## 2016-03-15 DIAGNOSIS — Z0000 Encounter for general adult medical examination without abnormal findings: Secondary | ICD-10-CM | POA: Insufficient documentation

## 2016-03-15 DIAGNOSIS — R1902 Left upper quadrant abdominal swelling, mass and lump: Secondary | ICD-10-CM

## 2016-03-15 DIAGNOSIS — R0683 Snoring: Secondary | ICD-10-CM | POA: Insufficient documentation

## 2016-03-15 DIAGNOSIS — Z1211 Encounter for screening for malignant neoplasm of colon: Secondary | ICD-10-CM

## 2016-03-15 DIAGNOSIS — Z720 Tobacco use: Secondary | ICD-10-CM

## 2016-03-15 DIAGNOSIS — Z23 Encounter for immunization: Secondary | ICD-10-CM

## 2016-03-15 DIAGNOSIS — Z125 Encounter for screening for malignant neoplasm of prostate: Secondary | ICD-10-CM

## 2016-03-15 LAB — CBC WITH DIFFERENTIAL/PLATELET
Basophils Absolute: 0 cells/uL (ref 0–200)
Basophils Relative: 0 %
Eosinophils Absolute: 130 cells/uL (ref 15–500)
Eosinophils Relative: 1 %
HCT: 43.6 % (ref 38.5–50.0)
Hemoglobin: 14.9 g/dL (ref 13.2–17.1)
Lymphocytes Relative: 30 %
Lymphs Abs: 3900 cells/uL (ref 850–3900)
MCH: 30.2 pg (ref 27.0–33.0)
MCHC: 34.2 g/dL (ref 32.0–36.0)
MCV: 88.3 fL (ref 80.0–100.0)
MPV: 10.7 fL (ref 7.5–12.5)
Monocytes Absolute: 650 cells/uL (ref 200–950)
Monocytes Relative: 5 %
Neutro Abs: 8320 cells/uL — ABNORMAL HIGH (ref 1500–7800)
Neutrophils Relative %: 64 %
Platelets: 274 10*3/uL (ref 140–400)
RBC: 4.94 MIL/uL (ref 4.20–5.80)
RDW: 15.1 % — ABNORMAL HIGH (ref 11.0–15.0)
WBC: 13 10*3/uL — ABNORMAL HIGH (ref 3.8–10.8)

## 2016-03-15 LAB — POCT GLYCOSYLATED HEMOGLOBIN (HGB A1C): Hemoglobin A1C: 6.4

## 2016-03-15 LAB — TSH: TSH: 1.99 mIU/L (ref 0.40–4.50)

## 2016-03-15 NOTE — Patient Instructions (Addendum)
Health Maintenance, Male A healthy lifestyle and preventative care can promote health and wellness.  Maintain regular health, dental, and eye exams.  Eat a healthy diet. Foods like vegetables, fruits, whole grains, low-fat dairy products, and lean protein foods contain the nutrients you need and are low in calories. Decrease your intake of foods high in solid fats, added sugars, and salt. Get information about a proper diet from your health care provider, if necessary.  Regular physical exercise is one of the most important things you can do for your health. Most adults should get at least 150 minutes of moderate-intensity exercise (any activity that increases your heart rate and causes you to sweat) each week. In addition, most adults need muscle-strengthening exercises on 2 or more days a week.   Maintain a healthy weight. The body mass index (BMI) is a screening tool to identify possible weight problems. It provides an estimate of body fat based on height and weight. Your health care provider can find your BMI and can help you achieve or maintain a healthy weight. For males 20 years and older:  A BMI below 18.5 is considered underweight.  A BMI of 18.5 to 24.9 is normal.  A BMI of 25 to 29.9 is considered overweight.  A BMI of 30 and above is considered obese.  Maintain normal blood lipids and cholesterol by exercising and minimizing your intake of saturated fat. Eat a balanced diet with plenty of fruits and vegetables. Blood tests for lipids and cholesterol should begin at age 20 and be repeated every 5 years. If your lipid or cholesterol levels are high, you are over age 50, or you are at high risk for heart disease, you may need your cholesterol levels checked more frequently.Ongoing high lipid and cholesterol levels should be treated with medicines if diet and exercise are not working.  If you smoke, find out from your health care provider how to quit. If you do not use tobacco, do not  start.  Lung cancer screening is recommended for adults aged 55-80 years who are at high risk for developing lung cancer because of a history of smoking. A yearly low-dose CT scan of the lungs is recommended for people who have at least a 30-pack-year history of smoking and are current smokers or have quit within the past 15 years. A pack year of smoking is smoking an average of 1 pack of cigarettes a day for 1 year (for example, a 30-pack-year history of smoking could mean smoking 1 pack a day for 30 years or 2 packs a day for 15 years). Yearly screening should continue until the smoker has stopped smoking for at least 15 years. Yearly screening should be stopped for people who develop a health problem that would prevent them from having lung cancer treatment.  If you choose to drink alcohol, do not have more than 2 drinks per day. One drink is considered to be 12 oz (360 mL) of beer, 5 oz (150 mL) of wine, or 1.5 oz (45 mL) of liquor.  Avoid the use of street drugs. Do not share needles with anyone. Ask for help if you need support or instructions about stopping the use of drugs.  High blood pressure causes heart disease and increases the risk of stroke. High blood pressure is more likely to develop in:  People who have blood pressure in the end of the normal range (100-139/85-89 mm Hg).  People who are overweight or obese.  People who are African American.    If you are 18-39 years of age, have your blood pressure checked every 3-5 years. If you are 40 years of age or older, have your blood pressure checked every year. You should have your blood pressure measured twice--once when you are at a hospital or clinic, and once when you are not at a hospital or clinic. Record the average of the two measurements. To check your blood pressure when you are not at a hospital or clinic, you can use:  An automated blood pressure machine at a pharmacy.  A home blood pressure monitor.  If you are 45-79 years  old, ask your health care provider if you should take aspirin to prevent heart disease.  Diabetes screening involves taking a blood sample to check your fasting blood sugar level. This should be done once every 3 years after age 45 if you are at a normal weight and without risk factors for diabetes. Testing should be considered at a younger age or be carried out more frequently if you are overweight and have at least 1 risk factor for diabetes.  Colorectal cancer can be detected and often prevented. Most routine colorectal cancer screening begins at the age of 50 and continues through age 75. However, your health care provider may recommend screening at an earlier age if you have risk factors for colon cancer. On a yearly basis, your health care provider may provide home test kits to check for hidden blood in the stool. A small camera at the end of a tube may be used to directly examine the colon (sigmoidoscopy or colonoscopy) to detect the earliest forms of colorectal cancer. Talk to your health care provider about this at age 50 when routine screening begins. A direct exam of the colon should be repeated every 5-10 years through age 75, unless early forms of precancerous polyps or small growths are found.  People who are at an increased risk for hepatitis B should be screened for this virus. You are considered at high risk for hepatitis B if:  You were born in a country where hepatitis B occurs often. Talk with your health care provider about which countries are considered high risk.  Your parents were born in a high-risk country and you have not received a shot to protect against hepatitis B (hepatitis B vaccine).  You have HIV or AIDS.  You use needles to inject street drugs.  You live with, or have sex with, someone who has hepatitis B.  You are a man who has sex with other men (MSM).  You get hemodialysis treatment.  You take certain medicines for conditions like cancer, organ  transplantation, and autoimmune conditions.  Hepatitis C blood testing is recommended for all people born from 1945 through 1965 and any individual with known risk factors for hepatitis C.  Healthy men should no longer receive prostate-specific antigen (PSA) blood tests as part of routine cancer screening. Talk to your health care provider about prostate cancer screening.  Testicular cancer screening is not recommended for adolescents or adult males who have no symptoms. Screening includes self-exam, a health care provider exam, and other screening tests. Consult with your health care provider about any symptoms you have or any concerns you have about testicular cancer.  Practice safe sex. Use condoms and avoid high-risk sexual practices to reduce the spread of sexually transmitted infections (STIs).  You should be screened for STIs, including gonorrhea and chlamydia if:  You are sexually active and are younger than 24 years.  You   are older than 24 years, and your health care provider tells you that you are at risk for this type of infection.  Your sexual activity has changed since you were last screened, and you are at an increased risk for chlamydia or gonorrhea. Ask your health care provider if you are at risk.  If you are at risk of being infected with HIV, it is recommended that you take a prescription medicine daily to prevent HIV infection. This is called pre-exposure prophylaxis (PrEP). You are considered at risk if:  You are a man who has sex with other men (MSM).  You are a heterosexual man who is sexually active with multiple partners.  You take drugs by injection.  You are sexually active with a partner who has HIV.  Talk with your health care provider about whether you are at high risk of being infected with HIV. If you choose to begin PrEP, you should first be tested for HIV. You should then be tested every 3 months for as long as you are taking PrEP.  Use sunscreen. Apply  sunscreen liberally and repeatedly throughout the day. You should seek shade when your shadow is shorter than you. Protect yourself by wearing long sleeves, pants, a wide-brimmed hat, and sunglasses year round whenever you are outdoors.  Tell your health care provider of new moles or changes in moles, especially if there is a change in shape or color. Also, tell your health care provider if a mole is larger than the size of a pencil eraser.  A one-time screening for abdominal aortic aneurysm (AAA) and surgical repair of large AAAs by ultrasound is recommended for men aged 61-75 years who are current or former smokers.  Stay current with your vaccines (immunizations).   This information is not intended to replace advice given to you by your health care provider. Make sure you discuss any questions you have with your health care provider.   Document Released: 12/11/2007 Document Revised: 07/05/2014 Document Reviewed: 11/09/2010 Elsevier Interactive Patient Education 2016 [place] Can Quit Smoking If you are ready to quit smoking or are thinking about it, congratulations! You have chosen to help yourself be healthier and live longer! There are lots of different ways to quit smoking. Nicotine gum, nicotine patches, a nicotine inhaler, or nicotine nasal spray can help with physical craving. Hypnosis, support groups, and medicines help break the habit of smoking. TIPS TO GET OFF AND STAY OFF CIGARETTES  Learn to predict your moods. Do not let a bad situation be your excuse to have a cigarette. Some situations in your life might tempt you to have a cigarette.  Ask friends and co-workers not to smoke around you.  Make your home smoke-free.  Never have "just one" cigarette. It leads to wanting another and another. Remind yourself of your decision to quit.  On a card, make a list of your reasons for not smoking. Read it at least the same number of times a day as you have a cigarette.  Tell yourself everyday, "I do not want to smoke. I choose not to smoke."  Ask someone at home or work to help you with your plan to quit smoking.  Have something planned after you eat or have a cup of coffee. Take a walk or get other exercise to perk you up. This will help to keep you from overeating.  Try a relaxation exercise to calm you down and decrease your stress. Remember, you may be tense  and nervous the first two weeks after you quit. This will pass.  Find new activities to keep your hands busy. Play with a pen, coin, or rubber band. Doodle or draw things on paper.  Brush your teeth right after eating. This will help cut down the craving for the taste of tobacco after meals. You can try mouthwash too.  Try gum, breath mints, or diet candy to keep something in your mouth. IF YOU SMOKE AND WANT TO QUIT:  Do not stock up on cigarettes. Never buy a carton. Wait until one pack is finished before you buy another.  Never carry cigarettes with you at work or at home.  Keep cigarettes as far away from you as possible. Leave them with someone else.  Never carry matches or a lighter with you.  Ask yourself, "Do I need this cigarette or is this just a reflex?"  Bet with someone that you can quit. Put cigarette money in a piggy bank every morning. If you smoke, you give up the money. If you do not smoke, by the end of the week, you keep the money.  Keep trying. It takes 21 days to change a habit!  Talk to your doctor about using medicines to help you quit. These include nicotine replacement gum, lozenges, or skin patches.   This information is not intended to replace advice given to you by your health care provider. Make sure you discuss any questions you have with your health care provider.   Document Released: 04/10/2009 Document Revised: 09/06/2011 Document Reviewed: 04/10/2009 Elsevier Interactive Patient Education [employer].

## 2016-03-15 NOTE — Progress Notes (Signed)
Jorge [name], is a 50 y.o. male  [id number]  [id number]  DOB - [birth year]  CC:  Chief Complaint  Patient presents with  . New Patient (Initial Visit)       HPI: Jorge [name] is a 50 y.o. male here today to establish medical care, new to our clinic.  Pt states that about 2 years ago, he had rib fracture on left side due to coughing spells from pneumonia.  Since than, he denies breathing problems, but does get occasional muscle spasms at the sight.   +He now has notice a lump in that area, progressively enlarging he thinks.   Denies gi c/o accept for "blood streaks" when he eats very spicy jalapeno peppers. Denies melena, hematochezia, hematemesis, weight loss.    +daytime somnolence, and snores at night, concerned for OSA as well.  He smokes 1/3 ppd, use to smoke 1ppd, but working on decreasing now.  Wanting to get his health check now that is getting older, and wants to be around for his grandkids.  Patient has No headache, No chest pain, No abdominal pain - No Nausea, No new weakness tingling or numbness, No Cough - SOB.    Review of Systems: Per HPI, o/w all systems reviewed and negative.   No Known Allergies Past Medical History:  Diagnosis Date  . Environmental allergies   . Sinus infection    Current Outpatient Prescriptions on File Prior to Visit  Medication Sig Dispense Refill  . albuterol (PROVENTIL HFA;VENTOLIN HFA) 108 (90 BASE) MCG/ACT inhaler Inhale 1-2 puffs into the lungs every 6 (six) hours as needed for wheezing or shortness of breath. 1 Inhaler 0  . azithromycin (ZITHROMAX) 250 MG tablet Take 1 tablet (250 mg total) by mouth daily. Take first 2 tablets together, then 1 every day until finished. (Patient not taking: Reported on 03/15/2016) 6 tablet 0  . chlorpheniramine-HYDROcodone (TUSSIONEX PENNKINETIC ER) 10-8 MG/5ML LQCR Take 5 mLs by mouth every 12 (twelve) hours as needed for cough. (Patient not taking: Reported on 03/15/2016) 115 mL 0    . guaifenesin (ROBITUSSIN) 100 MG/5ML syrup Take 5-10 mLs (100-200 mg total) by mouth every 4 (four) hours as needed for cough. (Patient not taking: Reported on 03/15/2016) 60 mL 0  . HYDROcodone-homatropine (HYCODAN) 5-1.5 MG/5ML syrup Take 5 mLs by mouth every 6 (six) hours as needed for cough. (Patient not taking: Reported on 03/15/2016) 120 mL 0  . naproxen sodium (ALEVE) 220 MG tablet Take 220 mg by mouth 2 (two) times daily with a meal.    . oxyCODONE-acetaminophen (PERCOCET/ROXICET) 5-325 MG per tablet Take 1-2 tablets by mouth every 4 (four) hours as needed for severe pain. (Patient not taking: Reported on 03/15/2016) 30 tablet 0  . oxymetazoline (AFRIN) 0.05 % nasal spray Place 1 spray into both nostrils 2 (two) times daily.     No current facility-administered medications on file prior to visit.    No family history on file. Social History   Social History  . Marital status: Single    Spouse name: N/A  . Number of children: N/A  . Years of education: N/A   Occupational History  . Not on file.   Social History Main Topics  . Smoking status: Current Every Day Smoker    Packs/day: 0.25    Types: Cigarettes  . Smokeless tobacco: Not on file  . Alcohol use No  . Drug use:     Types: Marijuana     Comment: occasionally  . Sexual activity: Not  on file   Other Topics Concern  . Not on file   Social History Narrative  . No narrative on file    Objective:   Vitals:   03/15/16 1625  BP: (!) 138/95  Pulse: 93  Temp: 98.2 F (36.8 C)    Filed Weights   03/15/16 1625  Weight: 221 lb 6.4 oz (100.4 kg)    BP Readings from Last 3 Encounters:  03/15/16 (!) 138/95  10/01/14 143/76  10/01/13 151/89    Physical Exam: Constitutional: Patient appears well-developed and well-nourished. No distress. AAOx3, abd obesity. HENT: Normocephalic, atraumatic, External right and left ear normal. Oropharynx is clear and moist. bilat TMs clear. Eyes: Conjunctivae and EOM are  normal. PERRL, no scleral icterus. Neck: Normal ROM. Neck supple. No JVD.  CVS: RRR, S1/S2 +, no murmurs, no gallops, no carotid bruit.  Pulmonary: Effort and breath sounds normal, no stridor, rhonchi, wheezes, rales.  Abdominal: Soft. Obese,  ?lump vs lipoma luq, below 12 rib, slight ttp on palpation, no loss of sensation, no hsm,   BS +,  Neg  rebound or guarding.  Musculoskeletal: Normal range of motion. No edema and no tenderness.  LE: bilat/ no c/c/e, pulses 2+ bilateral. Neuro: Alert.muscle tone coordination wnl. No cranial nerve deficit grossly. Skin: Skin is warm and dry. No rash noted. Not diaphoretic. No erythema. No pallor. Psychiatric: Normal mood and affect. Behavior, judgment, thought content normal.  Lab Results  Component Value Date   WBC 13.6 (H) 10/01/2014   HGB 13.7 10/01/2014   HCT 42.1 10/01/2014   MCV 90.9 10/01/2014   PLT 365 10/01/2014   Lab Results  Component Value Date   CREATININE 1.19 10/01/2014   BUN 9 10/01/2014   NA 142 10/01/2014   K 3.9 10/01/2014   CL 106 10/01/2014   CO2 29 10/01/2014    No results found for: HGBA1C Lipid Panel  No results found for: CHOL, TRIG, HDL, CHOLHDL, VLDL, LDLCALC     Depression screen [facility] 2/9 03/15/2016  Decreased Interest 0  Down, Depressed, Hopeless 0  PHQ - 2 Score 0  Altered sleeping 0  Tired, decreased energy 0  Change in appetite 0  Feeling bad or failure about yourself  0  Trouble concentrating 0  Moving slowly or fidgety/restless 0  Suicidal thoughts 0  PHQ-9 Score 0    Assessment and plan:   1. Healthcare maintenance - BASIC METABOLIC PANEL WITH GFR - TSH - CBC with Differential/Platelet  2. Tobacco abuse tob cessation recd, tips provided  3. Flu vaccine need - Flu Vaccine QUAD 36+ mos PF IM (Fluarix & Fluzone Quad PF)  4. Prostate cancer screening - PSA  5. Abdominal mass, left upper quadrant  ? Vs lipoma - US Abdomen Limited; Future  6. Diabetes mellitus screening - HgB  A1c  7. Daytime somnolence - Split night study; Future  8. Colon cancer screening amb ref gi, recd pt avoids spicy foods since he said he noted "red streaks" after he eats jalopenos, denies gerd c/o though, no other worrisome s/ses.   Return in about 3 months (around 06/14/2016).  The patient was given clear instructions to go to ER or return to medical center if symptoms don't improve, worsen or new problems develop. The patient verbalized understanding. The patient was told to call to get lab results if they haven't heard anything in the next week.    This note has been created with Surveyor, quantity. Any transcriptional  errors are unintentional.   Maren Reamer, MD, [facility], [place]   03/15/2016, 4:46 PM

## 2016-03-16 ENCOUNTER — Other Ambulatory Visit: Payer: Self-pay | Admitting: Internal Medicine

## 2016-03-16 LAB — BASIC METABOLIC PANEL WITH GFR
BUN: 13 mg/dL (ref 7–25)
CO2: 26 mmol/L (ref 20–31)
Calcium: 9.5 mg/dL (ref 8.6–10.3)
Chloride: 107 mmol/L (ref 98–110)
Creat: 1.13 mg/dL (ref 0.70–1.33)
GFR, Est African American: 87 mL/min (ref >=60)
GFR, Est Non African American: 75 mL/min (ref >=60)
Glucose, Bld: 101 mg/dL — ABNORMAL HIGH (ref 65–99)
Potassium: 4.1 mmol/L (ref 3.5–5.3)
Sodium: 143 mmol/L (ref 135–146)

## 2016-03-16 LAB — PSA: PSA: 0.2 ng/mL (ref <=4.0)

## 2016-03-16 MED ORDER — METFORMIN HCL 500 MG PO TABS: 500.0000 mg | ORAL_TABLET | Freq: Every day | ORAL | 3 refills | Status: DC

## 2016-03-17 MED FILL — metFORMIN HCL 500 MG TABS: 500 | 30 days supply | Qty: 30 | Fill #0

## 2016-03-18 ENCOUNTER — Telehealth: Payer: Self-pay

## 2016-03-18 NOTE — Telephone Encounter (Signed)
Contacted pt to go over lab results pt didn't answer lvm for pt to give me a call back at his earliest convenience to go over the results

## 2016-03-18 NOTE — Telephone Encounter (Signed)
Pt returned call and went over lab results with pt. Pt is aware of results and his appointment for his ultrasound is schedule for March 22, 2016 @930  and pt is to arrive 15 mins early

## 2016-03-22 ENCOUNTER — Ambulatory Visit (HOSPITAL_COMMUNITY)
Admission: RE | Admit: 2016-03-22 | Discharge: 2016-03-22 | Disposition: A | Discharge status: Home / Self Care | Payer: Self-pay | Source: Ambulatory Visit | Attending: Internal Medicine | Admitting: Internal Medicine

## 2016-03-22 ENCOUNTER — Ambulatory Visit (HOSPITAL_COMMUNITY): Payer: Self-pay

## 2016-03-22 DIAGNOSIS — R1902 Left upper quadrant abdominal swelling, mass and lump: Secondary | ICD-10-CM | POA: Insufficient documentation

## 2016-03-24 ENCOUNTER — Telehealth: Payer: Self-pay

## 2016-03-24 NOTE — Telephone Encounter (Signed)
Contacted pt to go over [place] resultsa pt didn't answer lvm for pt to give me a call at his earliest convenience

## 2016-03-31 ENCOUNTER — Telehealth: Payer: Self-pay

## 2016-03-31 NOTE — Telephone Encounter (Signed)
Contacted pt to go over [place] results pt did not answer lvm making pt aware that I will be mailing results out and if he has any questions or concerns to give me a call

## 2016-03-31 NOTE — Telephone Encounter (Signed)
Contacted pt to go over [place] results pt didn't answer lvm informing pt I will be mailing results out and if he has any questions or concerns to give me a call

## 2016-06-10 ENCOUNTER — Ambulatory Visit: Payer: Self-pay

## 2016-06-11 ENCOUNTER — Ambulatory Visit: Payer: Self-pay | Attending: Internal Medicine

## 2016-08-12 ENCOUNTER — Ambulatory Visit: Payer: Self-pay | Attending: Physician Assistant | Admitting: Physician Assistant

## 2016-08-12 VITALS — BP 158/99 | HR 99 | Temp 98.0°F | Resp 20 | Wt 217.8 lb

## 2016-08-12 DIAGNOSIS — IMO0002 Reserved for concepts with insufficient information to code with codable children: Secondary | ICD-10-CM

## 2016-08-12 DIAGNOSIS — J069 Acute upper respiratory infection, unspecified: Secondary | ICD-10-CM | POA: Insufficient documentation

## 2016-08-12 DIAGNOSIS — M779 Enthesopathy, unspecified: Secondary | ICD-10-CM | POA: Insufficient documentation

## 2016-08-12 DIAGNOSIS — R030 Elevated blood-pressure reading, without diagnosis of hypertension: Secondary | ICD-10-CM

## 2016-08-12 DIAGNOSIS — Z7984 Long term (current) use of oral hypoglycemic drugs: Secondary | ICD-10-CM | POA: Insufficient documentation

## 2016-08-12 DIAGNOSIS — M778 Other enthesopathies, not elsewhere classified: Secondary | ICD-10-CM

## 2016-08-12 DIAGNOSIS — R7303 Prediabetes: Secondary | ICD-10-CM | POA: Insufficient documentation

## 2016-08-12 MED ORDER — NAPROXEN 500 MG PO TABS: 500.0000 mg | ORAL_TABLET | Freq: Two times a day (BID) | ORAL | 0 refills | Status: DC

## 2016-08-12 MED ORDER — DM-APAP-CPM 15-500-2 MG PO TABS: 2.0000 | ORAL_TABLET | Freq: Four times a day (QID) | ORAL | 0 refills | Status: AC

## 2016-08-12 MED ORDER — CYCLOBENZAPRINE HCL 10 MG PO TABS: 10.0000 mg | ORAL_TABLET | Freq: Every day | ORAL | 0 refills | Status: DC

## 2016-08-12 MED ORDER — SALINE SPRAY 0.65 % NA SOLN: 1.0000 | NASAL | 0 refills | Status: DC | PRN

## 2016-08-12 MED ORDER — GUAIFENESIN ER 1200 MG PO TB12: 1.0000 | ORAL_TABLET | Freq: Two times a day (BID) | ORAL | 0 refills | Status: AC

## 2016-08-12 NOTE — Patient Instructions (Signed)
Please return in 2 weeks to address elevated blood pressure and prediabetes. Take medications as directed. Use Augmentin (antibiotic) is symptoms persist or worsen at day 7 of illness. Call us or go to the emergency room if there are any particularly worrisome symptoms. Also, please refrain from heavy lifting or repetitive motion with the left arm. It may be possible that you need physical therapy if no better with medications.  Upper Respiratory Infection, Adult Most upper respiratory infections (URIs) are a viral infection of the air passages leading to the lungs. A URI affects the nose, throat, and upper air passages. The most common type of URI is nasopharyngitis and is typically referred to as "the common cold." URIs run their course and usually go away on their own. Most of the time, a URI does not require medical attention, but sometimes a bacterial infection in the upper airways can follow a viral infection. This is called a secondary infection. Sinus and middle ear infections are common types of secondary upper respiratory infections. Bacterial pneumonia can also complicate a URI. A URI can worsen asthma and chronic obstructive pulmonary disease (COPD). Sometimes, these complications can require emergency medical care and may be life threatening. What are the causes? Almost all URIs are caused by viruses. A virus is a type of germ and can spread from one person to another. What increases the risk? You may be at risk for a URI if:  You smoke.  You have chronic heart or lung disease.  You have a weakened defense (immune) system.  You are very young or very old.  You have nasal allergies or asthma.  You work in crowded or poorly ventilated areas.  You work in health care facilities or schools. What are the signs or symptoms? Symptoms typically develop 2-3 days after you come in contact with a cold virus. Most viral URIs last 7-10 days. However, viral URIs from the influenza virus (flu  virus) can last 14-18 days and are typically more severe. Symptoms may include:  Runny or stuffy (congested) nose.  Sneezing.  Cough.  Sore throat.  Headache.  Fatigue.  Fever.  Loss of appetite.  Pain in your forehead, behind your eyes, and over your cheekbones (sinus pain).  Muscle aches. How is this diagnosed? Your health care provider may diagnose a URI by:  Physical exam.  Tests to check that your symptoms are not due to another condition such as:  Strep throat.  Sinusitis.  Pneumonia.  Asthma. How is this treated? A URI goes away on its own with time. It cannot be cured with medicines, but medicines may be prescribed or recommended to relieve symptoms. Medicines may help:  Reduce your fever.  Reduce your cough.  Relieve nasal congestion. Follow these instructions at home:  Take medicines only as directed by your health care provider.  Gargle warm saltwater or take cough drops to comfort your throat as directed by your health care provider.  Use a warm mist humidifier or inhale steam from a shower to increase air moisture. This may make it easier to breathe.  Drink enough fluid to keep your urine clear or pale yellow.  Eat soups and other clear broths and maintain good nutrition.  Rest as needed.  Return to work when your temperature has returned to normal or as your health care provider advises. You may need to stay home longer to avoid infecting others. You can also use a face mask and careful hand washing to prevent spread of the virus.  Increase the usage of your inhaler if you have asthma.  Do not use any tobacco products, including cigarettes, chewing tobacco, or electronic cigarettes. If you need help quitting, ask your health care provider. How is this prevented? The best way to protect yourself from getting a cold is to practice good hygiene.  Avoid oral or hand contact with people with cold symptoms.  Wash your hands often if contact  occurs. There is no clear evidence that vitamin C, vitamin E, echinacea, or exercise reduces the chance of developing a cold. However, it is always recommended to get plenty of rest, exercise, and practice good nutrition. Contact a health care provider if:  You are getting worse rather than better.  Your symptoms are not controlled by medicine.  You have chills.  You have worsening shortness of breath.  You have brown or red mucus.  You have yellow or brown nasal discharge.  You have pain in your face, especially when you bend forward.  You have a fever.  You have swollen neck glands.  You have pain while swallowing.  You have white areas in the back of your throat. Get help right away if:  You have severe or persistent:  Headache.  Ear pain.  Sinus pain.  Chest pain.  You have chronic lung disease and any of the following:  Wheezing.  Prolonged cough.  Coughing up blood.  A change in your usual mucus.  You have a stiff neck.  You have changes in your:  Vision.  Hearing.  Thinking.  Mood. This information is not intended to replace advice given to you by your health care provider. Make sure you discuss any questions you have with your health care provider. Document Released: 12/08/2000 Document Revised: 02/15/2016 Document Reviewed: 09/19/2013 Elsevier Interactive Patient Education  2017 [employer].

## 2016-08-12 NOTE — Progress Notes (Signed)
Subjective:  Patient ID: Jorge [name], male    DOB: Jul 08, 1965  Age: 51 y.o. MRN: [id number]  CC:  Cold like symptoms  HPI Jorge [name] is a 51 y.o. male with a medical history of prediabetes (a1c 6.4 on 03/15/16) Presents with a 2 day history of cold-like symptoms. Symptoms include mild fatigue, nasal congestion, and cough. Has not taken anything for relief. Attributed to exposure to sick coworkers. Denies chest pain, shortness of breath, headache, abdominal pain, fever, chills, nausea, vomiting, rash, or GI/GU symptoms. In addition, he also complains of left elbow and forearm pain in the region of the lateral and medial epicondyles. Attributed to heavy lifting and repetitive motion at work. No current paresthesias,weakness,  edema, erythema or ecchymosis.   Outpatient Medications Prior to Visit  Medication Sig Dispense Refill  . albuterol (PROVENTIL HFA;VENTOLIN HFA) 108 (90 BASE) MCG/ACT inhaler Inhale 1-2 puffs into the lungs every 6 (six) hours as needed for wheezing or shortness of breath. (Patient not taking: Reported on 08/12/2016) 1 Inhaler 0  . azithromycin (ZITHROMAX) 250 MG tablet Take 1 tablet (250 mg total) by mouth daily. Take first 2 tablets together, then 1 every day until finished. (Patient not taking: Reported on 03/15/2016) 6 tablet 0  . chlorpheniramine-HYDROcodone (TUSSIONEX PENNKINETIC ER) 10-8 MG/5ML LQCR Take 5 mLs by mouth every 12 (twelve) hours as needed for cough. (Patient not taking: Reported on 03/15/2016) 115 mL 0  . guaifenesin (ROBITUSSIN) 100 MG/5ML syrup Take 5-10 mLs (100-200 mg total) by mouth every 4 (four) hours as needed for cough. (Patient not taking: Reported on 03/15/2016) 60 mL 0  . HYDROcodone-homatropine (HYCODAN) 5-1.5 MG/5ML syrup Take 5 mLs by mouth every 6 (six) hours as needed for cough. (Patient not taking: Reported on 03/15/2016) 120 mL 0  . metFORMIN (GLUCOPHAGE) 500 MG tablet Take 1 tablet (500 mg total) by mouth daily with breakfast.  (Patient not taking: Reported on 08/12/2016) 90 tablet 3  . naproxen sodium (ALEVE) 220 MG tablet Take 220 mg by mouth 2 (two) times daily with a meal.    . oxyCODONE-acetaminophen (PERCOCET/ROXICET) 5-325 MG per tablet Take 1-2 tablets by mouth every 4 (four) hours as needed for severe pain. (Patient not taking: Reported on 03/15/2016) 30 tablet 0  . oxymetazoline (AFRIN) 0.05 % nasal spray Place 1 spray into both nostrils 2 (two) times daily.     No facility-administered medications prior to visit.      ROS Review of Systems  Constitutional: Positive for malaise/fatigue. Negative for chills and fever.  HENT: Positive for sinus pain (Sinus congestion). Negative for ear pain and sore throat.   Eyes: Negative for blurred vision.  Respiratory: Positive for cough. Negative for shortness of breath.   Cardiovascular: Negative for chest pain and palpitations.  Gastrointestinal: Negative for abdominal pain, nausea and vomiting.  Genitourinary: Negative for dysuria and hematuria.  Musculoskeletal: Positive for joint pain (Left elbow) and myalgias (Left forearm).  Skin: Negative for rash.  Neurological: Negative for tingling and headaches.  Psychiatric/Behavioral: Negative for depression. The patient is not nervous/anxious.     Objective:  BP (!) 158/99 (BP Location: Left Arm, Patient Position: Sitting, Cuff Size: Normal)   Pulse 99   Temp 98 F (36.7 C) (Oral)   Resp 20   Wt 217 lb 12.8 oz (98.8 kg)   SpO2 95%   BMI 37.39 kg/m   BP/Weight 08/12/2016 [id number]  Systolic BP 0000000 0000000 [id number]  Diastolic BP 99 95 76  Wt. (Lbs) 217.8 221.4  220  BMI 37.39 38 37.74      Physical Exam  Constitutional: He is oriented to person, place, and time.  Mildly ill, NAD, polite  HENT:  Head: Normocephalic and atraumatic.  TM mildly erythematous bilaterally, turbinates erythematous and hypertrophic, oropharynx with mild postnasal drip and no exudates.   Eyes: Conjunctivae are normal. No  scleral icterus.  Neck: Normal range of motion. Neck supple. No thyromegaly present.  Cardiovascular: Normal rate, regular rhythm and normal heart sounds.   Pulmonary/Chest: Effort normal. He has no wheezes. He has no rales.  Abdominal: Soft. Bowel sounds are normal.  Musculoskeletal:  Left elbow with full active range of motion, mild tenderness to palpation over the lateral and medial epicondyle, no erythema/edema/ecchymosis, strength 5/5  Lymphadenopathy:    He has cervical adenopathy (mild anterior cervical lymphadenopathy).  Neurological: He is alert and oriented to person, place, and time.  Skin: Skin is warm and dry.  Psychiatric: His behavior is normal.     Assessment & Plan:   1. Upper respiratory tract infection, unspecified type -Supportive therapy for now. Advised to call if symptoms persist or worsen.  2. Tendonitis of elbow or forearm - Advised to refrain from using the left arm for a period of approximately 1-2 weeks. Take medications as prescribed. Return to clinic if no better with possible referral to physical therapy.   Meds ordered this encounter  Medications  . Guaifenesin (MUCINEX MAXIMUM STRENGTH) 1200 MG TB12    Sig: Take 1 tablet (1,200 mg total) by mouth 2 (two) times daily.    Dispense:  10 tablet    Refill:  0    Order Specific Question:   Supervising Provider    Answer:   Tresa Garter [id number]  . sodium chloride (OCEAN) 0.65 % SOLN nasal spray    Sig: Place 1 spray into both nostrils as needed for congestion.    Dispense:  1 Bottle    Refill:  0    Order Specific Question:   Supervising Provider    Answer:   Tresa Garter [id number]  . DM-APAP-CPM (CORICIDIN HBP FLU) 15-500-2 MG TABS    Sig: Take 2 tablets by mouth every 6 (six) hours.    Dispense:  1 each    Refill:  0    Order Specific Question:   Supervising Provider    Answer:   Tresa Garter [id number]  . cyclobenzaprine (FLEXERIL) 10 MG tablet    Sig: Take 1 tablet  (10 mg total) by mouth at bedtime.    Dispense:  10 tablet    Refill:  0    Order Specific Question:   Supervising Provider    Answer:   Tresa Garter [id number]  . naproxen (NAPROSYN) 500 MG tablet    Sig: Take 1 tablet (500 mg total) by mouth 2 (two) times daily with a meal.    Dispense:  30 tablet    Refill:  0    Order Specific Question:   Supervising Provider    Answer:   Tresa Garter [id number]    Follow-up: Return in about 2 weeks (around 08/26/2016) for blood pressure and prediabetes follow up.   Clent Demark PA

## 2016-08-13 ENCOUNTER — Encounter: Payer: Self-pay | Admitting: Physician Assistant

## 2016-08-30 ENCOUNTER — Ambulatory Visit: Payer: Self-pay | Admitting: Internal Medicine

## 2016-10-04 ENCOUNTER — Ambulatory Visit: Payer: Self-pay | Attending: Internal Medicine | Admitting: Internal Medicine

## 2016-10-04 ENCOUNTER — Ambulatory Visit: Payer: Self-pay

## 2016-10-04 ENCOUNTER — Encounter: Payer: Self-pay | Admitting: Internal Medicine

## 2016-10-04 ENCOUNTER — Telehealth: Payer: Self-pay | Admitting: Internal Medicine

## 2016-10-04 VITALS — BP 161/89 | HR 94 | Temp 98.2°F | Resp 16 | Wt 225.2 lb

## 2016-10-04 DIAGNOSIS — I10 Essential (primary) hypertension: Secondary | ICD-10-CM | POA: Insufficient documentation

## 2016-10-04 DIAGNOSIS — R7303 Prediabetes: Secondary | ICD-10-CM

## 2016-10-04 DIAGNOSIS — E119 Type 2 diabetes mellitus without complications: Secondary | ICD-10-CM | POA: Insufficient documentation

## 2016-10-04 DIAGNOSIS — Z1211 Encounter for screening for malignant neoplasm of colon: Secondary | ICD-10-CM

## 2016-10-04 DIAGNOSIS — Z720 Tobacco use: Secondary | ICD-10-CM

## 2016-10-04 DIAGNOSIS — F17210 Nicotine dependence, cigarettes, uncomplicated: Secondary | ICD-10-CM | POA: Insufficient documentation

## 2016-10-04 LAB — GLUCOSE, POCT (MANUAL RESULT ENTRY): POC Glucose: 181 mg/dl — AB (ref 70–99)

## 2016-10-04 LAB — POCT GLYCOSYLATED HEMOGLOBIN (HGB A1C): Hemoglobin A1C: 6.7

## 2016-10-04 MED ORDER — LISINOPRIL-HYDROCHLOROTHIAZIDE 10-12.5 MG PO TABS: 1.0000 | ORAL_TABLET | Freq: Every day | ORAL | 3 refills | Status: DC

## 2016-10-04 MED ORDER — METFORMIN HCL 500 MG PO TABS
500.0000 mg | ORAL_TABLET | Freq: Two times a day (BID) | ORAL | 3 refills | Status: DC
Start: 2016-10-04 — End: 2018-09-06

## 2016-10-04 MED ORDER — GLUCOSE BLOOD VI STRP: ORAL_STRIP | 12 refills | Status: DC

## 2016-10-04 MED ORDER — TRUE METRIX GO GLUCOSE METER W/DEVICE KIT: 1.0000 | PACK | Freq: Three times a day (TID) | 0 refills | Status: DC | PRN

## 2016-10-04 MED ORDER — TRUEPLUS LANCETS 26G MISC: 1.0000 | Freq: Three times a day (TID) | 12 refills | Status: DC | PRN

## 2016-10-04 MED FILL — LISINOPRIL-HCTZ 10-12.5 MG: 10-12.5 | 30 days supply | Qty: 30 | Fill #0

## 2016-10-04 MED FILL — NAPROXEN 500 MG TABLET: 500 | 15 days supply | Qty: 30 | Fill #0

## 2016-10-04 MED FILL — ?CYCLOBENZAPRINE 10 MG TABL: 10 | 10 days supply | Qty: 10 | Fill #0

## 2016-10-04 NOTE — Telephone Encounter (Signed)
Patient would like a referral for a colonoscopy  

## 2016-10-04 NOTE — Progress Notes (Signed)
Jorge [name], is a 51 y.o. male  BOF:[id number]  DPO:[id number]  DOB - [birth year]  Chief Complaint  Patient presents with  . Cough  . Arm Pain        Subjective:   Jorge [name] is a 51 y.o. male here today for a follow up visit, last seen in clinic 08/12/16 by PA for URI and tendonitis. Pt was unable to pick up any meds, so still w/ some intermittent left elbow pains.  He notes a lot of heavy lifting at work.  Currently not taking any meds, does not watch his salt intake much. Stopped metformin after ran out of rx last year.   Patient has No headache, No chest pain, No abdominal pain - No Nausea, No new weakness tingling or numbness, No Cough - SOB.  No problems updated.  ALLERGIES: No Known Allergies  PAST MEDICAL HISTORY: Past Medical History:  Diagnosis Date  . Environmental allergies   . Sinus infection     MEDICATIONS AT HOME: Prior to Admission medications   Medication Sig Start Date End Date Taking? Authorizing Provider  cyclobenzaprine (FLEXERIL) 10 MG tablet Take 1 tablet (10 mg total) by mouth at bedtime. Patient not taking: Reported on 10/04/2016 08/12/16   Clent Demark, PA-C  lisinopril-hydrochlorothiazide (PRINZIDE,ZESTORETIC) 10-12.5 MG tablet Take 1 tablet by mouth daily. 10/04/16   Maren Reamer, MD  naproxen (NAPROSYN) 500 MG tablet Take 1 tablet (500 mg total) by mouth 2 (two) times daily with a meal. Patient not taking: Reported on 10/04/2016 08/12/16   Clent Demark, PA-C  sodium chloride (OCEAN) 0.65 % SOLN nasal spray Place 1 spray into both nostrils as needed for congestion. Patient not taking: Reported on 10/04/2016 08/12/16   Clent Demark, PA-C     Objective:   Vitals:   10/04/16 1437  BP: (!) 161/89  Pulse: 94  Resp: 16  Temp: 98.2 F (36.8 C)  TempSrc: Oral  SpO2: 96%  Weight: 225 lb 3.2 oz (102.2 kg)    Exam General appearance : Awake, alert, not in any distress. Speech Clear. Not toxic looking, obese,  pleasant. HEENT: Atraumatic and Normocephalic, pupils equally reactive to light. Neck: supple, no JVD.  Chest:Good air entry bilaterally, no added sounds. CVS: S1 S2 regular, no murmurs/gallups or rubs. Abdomen: Bowel sounds active, abd obese, Non tender and not distended with no gaurding, rigidity or rebound. Extremities: B/L Lower Ext shows no edema, both legs are warm to touch Neurology: Awake alert, and oriented X 3, CN II-XII grossly intact, Non focal Skin:No Rash  Data Review Lab Results  Component Value Date   HGBA1C 6.7 10/04/2016   HGBA1C 6.4 03/15/2016    Depression screen PHQ 2/9 10/04/2016 03/15/2016  Decreased Interest 0 0  Down, Depressed, Hopeless 0 0  PHQ - 2 Score 0 0  Altered sleeping - 0  Tired, decreased energy - 0  Change in appetite - 0  Feeling bad or failure about yourself  - 0  Trouble concentrating - 0  Moving slowly or fidgety/restless - 0  Suicidal thoughts - 0  PHQ-9 Score - 0      Assessment & Plan   1. HTN (hypertension), benign Uncontrolled, low salt diet recd. - start prinzide 10-12.5 qd - CBC with Differential - Basic metabolic panel - Travia RN bp check 2 wks, if sbp >130, than increase prinzide to 20-25 qd, please give him info on diabetes as well since he is new onset dm. I sent rx for  glucometer and supplies, he will need teaching on this as well. Fu w/ me in 2-3 wks if sbp >130, if not 62months. Thank you  2. Prediabetes - now Diabetes. Pt stopped metformin last year. - recd metformin 500bid for now. - ada diet discussed, info provided. - Microalbumin/Creatinine Ratio, Urine - HgB A1c  6.7 - POCT glucose (manual entry) - rx glucometer/supplies  3. Colon cancer screening - Ambulatory referral to Gastroenterology  4. Tobacco abuse Smoking less 1 cig/day. Total cessation recd.     Patient have been counseled extensively about nutrition and exercise  Return in about 3 months (around 01/03/2017).  The patient was given clear  instructions to go to ER or return to medical center if symptoms don't improve, worsen or new problems develop. The patient verbalized understanding. The patient was told to call to get lab results if they haven't heard anything in the next week.   This note has been created with Surveyor, quantity. Any transcriptional errors are unintentional.   Maren Reamer, MD, [facility], [place]   10/04/2016, 3:39 PM

## 2016-10-04 NOTE — Patient Instructions (Signed)
RN Carilyn Goodpasture - for bp check 2 wks.   Prediabetes Eating Plan Prediabetes-also called impaired glucose tolerance or impaired fasting glucose-is a condition that causes blood sugar (blood glucose) levels to be higher than normal. Following a healthy diet can help to keep prediabetes under control. It can also help to lower the risk of type 2 diabetes and heart disease, which are increased in people who have prediabetes. Along with regular exercise, a healthy diet:  Promotes weight loss.  Helps to control blood sugar levels.  Helps to improve the way that the body uses insulin. What do I need to know about this eating plan?  Use the glycemic index (GI) to plan your meals. The index tells you how quickly a food will raise your blood sugar. Choose low-GI foods. These foods take a longer time to raise blood sugar.  Pay close attention to the amount of carbohydrates in the food that you eat. Carbohydrates increase blood sugar levels.  Keep track of how many calories you take in. Eating the right amount of calories will help you to achieve a healthy weight. Losing about 7 percent of your starting weight can help to prevent type 2 diabetes.  You may want to follow a Mediterranean diet. This diet includes a lot of vegetables, lean meats or fish, whole grains, fruits, and healthy oils and fats. What foods can I eat? Grains  Whole grains, such as whole-wheat or whole-grain breads, crackers, cereals, and pasta. Unsweetened oatmeal. Bulgur. Barley. Quinoa. Brown rice. Corn or whole-wheat flour tortillas or taco shells. Vegetables  Lettuce. Spinach. Peas. Beets. Cauliflower. Cabbage. Broccoli. Carrots. Tomatoes. Squash. Eggplant. Herbs. Peppers. Onions. Cucumbers. Brussels sprouts. Fruits  Berries. Bananas. Apples. Oranges. Grapes. Papaya. Mango. Pomegranate. Kiwi. Grapefruit. Cherries. Meats and Other Protein Sources  Seafood. Lean meats, such as chicken and [place] or lean cuts of pork and beef. Tofu.  Eggs. Nuts. Beans. Dairy  Low-fat or fat-free dairy products, such as yogurt, cottage cheese, and cheese. Beverages  Water. Tea. Coffee. Sugar-free or diet soda. Seltzer water. Milk. Milk alternatives, such as soy or almond milk. Condiments  Mustard. Relish. Low-fat, low-sugar ketchup. Low-fat, low-sugar barbecue sauce. Low-fat or fat-free mayonnaise. Sweets and Desserts  Sugar-free or low-fat pudding. Sugar-free or low-fat ice cream and other frozen treats. Fats and Oils  Avocado. Walnuts. Olive oil. The items listed above may not be a complete list of recommended foods or beverages. Contact your dietitian for more options.  What foods are not recommended? Grains  Refined white flour and flour products, such as bread, pasta, snack foods, and cereals. Beverages  Sweetened drinks, such as sweet iced tea and soda. Sweets and Desserts  Baked goods, such as cake, cupcakes, pastries, cookies, and cheesecake. The items listed above may not be a complete list of foods and beverages to avoid. Contact your dietitian for more information.  This information is not intended to replace advice given to you by your health care provider. Make sure you discuss any questions you have with your health care provider. Document Released: 10/29/2014 Document Revised: 11/20/2015 Document Reviewed: 07/10/2014 Elsevier Interactive Patient Education  2017 Elsevier Inc.  -   Low-Sodium Eating Plan Sodium, which is an element that makes up salt, helps you maintain a healthy balance of fluids in your body. Too much sodium can increase your blood pressure and cause fluid and waste to be held in your body. Your health care provider or dietitian may recommend following this plan if you have high blood pressure (hypertension), kidney  disease, liver disease, or heart failure. Eating less sodium can help lower your blood pressure, reduce swelling, and protect your heart, liver, and kidneys. What are tips for following  this plan? General guidelines   Most people on this plan should limit their sodium intake to 1,500-2,000 mg (milligrams) of sodium each day. Reading food labels   The Nutrition Facts label lists the amount of sodium in one serving of the food. If you eat more than one serving, you must multiply the listed amount of sodium by the number of servings.  Choose foods with less than 140 mg of sodium per serving.  Avoid foods with 300 mg of sodium or more per serving. Shopping   Look for lower-sodium products, often labeled as "low-sodium" or "no salt added."  Always check the sodium content even if foods are labeled as "unsalted" or "no salt added".  Buy fresh foods.  Avoid canned foods and premade or frozen meals.  Avoid canned, cured, or processed meats  Buy breads that have less than 80 mg of sodium per slice. Cooking   Eat more home-cooked food and less restaurant, buffet, and fast food.  Avoid adding salt when cooking. Use salt-free seasonings or herbs instead of table salt or sea salt. Check with your health care provider or pharmacist before using salt substitutes.  Cook with plant-based oils, such as canola, sunflower, or olive oil. Meal planning   When eating at a restaurant, ask that your food be prepared with less salt or no salt, if possible.  Avoid foods that contain MSG (monosodium glutamate). MSG is sometimes added to [place] food, bouillon, and some canned foods. What foods are recommended? The items listed may not be a complete list. Talk with your dietitian about what dietary choices are best for you. Grains  Low-sodium cereals, including oats, puffed wheat and rice, and shredded wheat. Low-sodium crackers. Unsalted rice. Unsalted pasta. Low-sodium bread. Whole-grain breads and whole-grain pasta. Vegetables  Fresh or frozen vegetables. "No salt added" canned vegetables. "No salt added" tomato sauce and paste. Low-sodium or reduced-sodium tomato and vegetable  juice. Fruits  Fresh, frozen, or canned fruit. Fruit juice. Meats and other protein foods  Fresh or frozen (no salt added) meat, poultry, seafood, and fish. Low-sodium canned tuna and salmon. Unsalted nuts. Dried peas, beans, and lentils without added salt. Unsalted canned beans. Eggs. Unsalted nut butters. Dairy  Milk. Soy milk. Cheese that is naturally low in sodium, such as ricotta cheese, fresh mozzarella, or Swiss cheese Low-sodium or reduced-sodium cheese. Cream cheese. Yogurt. Fats and oils  Unsalted butter. Unsalted margarine with no trans fat. Vegetable oils such as canola or olive oils. Seasonings and other foods  Fresh and dried herbs and spices. Salt-free seasonings. Low-sodium mustard and ketchup. Sodium-free salad dressing. Sodium-free light mayonnaise. Fresh or refrigerated horseradish. Lemon juice. Vinegar. Homemade, reduced-sodium, or low-sodium soups. Unsalted popcorn and pretzels. Low-salt or salt-free chips. What foods are not recommended? The items listed may not be a complete list. Talk with your dietitian about what dietary choices are best for you. Grains  Instant hot cereals. Bread stuffing, pancake, and biscuit mixes. Croutons. Seasoned rice or pasta mixes. Noodle soup cups. Boxed or frozen macaroni and cheese. Regular salted crackers. Self-rising flour. Vegetables  Sauerkraut, pickled vegetables, and relishes. Olives. [place] fries. Onion rings. Regular canned vegetables (not low-sodium or reduced-sodium). Regular canned tomato sauce and paste (not low-sodium or reduced-sodium). Regular tomato and vegetable juice (not low-sodium or reduced-sodium). Frozen vegetables in sauces. Meats and other  protein foods  Meat or fish that is salted, canned, smoked, spiced, or pickled. Bacon, ham, sausage, hotdogs, corned beef, chipped beef, packaged lunch meats, salt pork, jerky, pickled herring, anchovies, regular canned tuna, sardines, salted nuts. Dairy  Processed cheese and cheese  spreads. Cheese curds. Blue cheese. Feta cheese. String cheese. Regular cottage cheese. Buttermilk. Canned milk. Fats and oils  Salted butter. Regular margarine. Ghee. Bacon fat. Seasonings and other foods  Onion salt, garlic salt, seasoned salt, table salt, and sea salt. Canned and packaged gravies. Worcestershire sauce. Tartar sauce. Barbecue sauce. Teriyaki sauce. Soy sauce, including reduced-sodium. Steak sauce. Fish sauce. Oyster sauce. Cocktail sauce. Horseradish that you find on the shelf. Regular ketchup and mustard. Meat flavorings and tenderizers. Bouillon cubes. Hot sauce and Tabasco sauce. Premade or packaged marinades. Premade or packaged taco seasonings. Relishes. Regular salad dressings. Salsa. Potato and tortilla chips. Corn chips and puffs. Salted popcorn and pretzels. Canned or dried soups. Pizza. Frozen entrees and pot pies. Summary  Eating less sodium can help lower your blood pressure, reduce swelling, and protect your heart, liver, and kidneys.  Most people on this plan should limit their sodium intake to 1,500-2,000 mg (milligrams) of sodium each day.  Canned, boxed, and frozen foods are high in sodium. Restaurant foods, fast foods, and pizza are also very high in sodium. You also get sodium by adding salt to food.  Try to cook at home, eat more fresh fruits and vegetables, and eat less fast food, canned, processed, or prepared foods. This information is not intended to replace advice given to you by your health care provider. Make sure you discuss any questions you have with your health care provider. Document Released: 12/04/2001 Document Revised: 06/07/2016 Document Reviewed: 06/07/2016 Elsevier Interactive Patient Education  2017 Elsevier Inc.   -  Hypertension Hypertension is another name for high blood pressure. High blood pressure forces your heart to work harder to pump blood. This can cause problems over time. There are two numbers in a blood pressure reading.  There is a top number (systolic) over a bottom number (diastolic). It is best to have a blood pressure below 120/80. Healthy choices can help lower your blood pressure. You may need medicine to help lower your blood pressure if:  Your blood pressure cannot be lowered with healthy choices.  Your blood pressure is higher than 130/80. Follow these instructions at home: Eating and drinking   If directed, follow the DASH eating plan. This diet includes:  Filling half of your plate at each meal with fruits and vegetables.  Filling one quarter of your plate at each meal with whole grains. Whole grains include whole wheat pasta, brown rice, and whole grain bread.  Eating or drinking low-fat dairy products, such as skim milk or low-fat yogurt.  Filling one quarter of your plate at each meal with low-fat (lean) proteins. Low-fat proteins include fish, skinless chicken, eggs, beans, and tofu.  Avoiding fatty meat, cured and processed meat, or chicken with skin.  Avoiding premade or processed food.  Eat less than 1,500 mg of salt (sodium) a day.  Limit alcohol use to no more than 1 drink a day for nonpregnant women and 2 drinks a day for men. One drink equals 12 oz of beer, 5 oz of wine, or 1 oz of hard liquor. Lifestyle   Work with your doctor to stay at a healthy weight or to lose weight. Ask your doctor what the best weight is for you.  Get at least  30 minutes of exercise that causes your heart to beat faster (aerobic exercise) most days of the week. This may include walking, swimming, or biking.  Get at least 30 minutes of exercise that strengthens your muscles (resistance exercise) at least 3 days a week. This may include lifting weights or pilates.  Do not use any products that contain nicotine or tobacco. This includes cigarettes and e-cigarettes. If you need help quitting, ask your doctor.  Check your blood pressure at home as told by your doctor.  Keep all follow-up visits as told  by your doctor. This is important. Medicines   Take over-the-counter and prescription medicines only as told by your doctor. Follow directions carefully.  Do not skip doses of blood pressure medicine. The medicine does not work as well if you skip doses. Skipping doses also puts you at risk for problems.  Ask your doctor about side effects or reactions to medicines that you should watch for. Contact a doctor if:  You think you are having a reaction to the medicine you are taking.  You have headaches that keep coming back (recurring).  You feel dizzy.  You have swelling in your ankles.  You have trouble with your vision. Get help right away if:  You get a very bad headache.  You start to feel confused.  You feel weak or numb.  You feel faint.  You get very bad pain in your:  Chest.  Belly (abdomen).  You throw up (vomit) more than once.  You have trouble breathing. Summary  Hypertension is another name for high blood pressure.  Making healthy choices can help lower blood pressure. If your blood pressure cannot be controlled with healthy choices, you may need to take medicine. This information is not intended to replace advice given to you by your health care provider. Make sure you discuss any questions you have with your health care provider. Document Released: 12/01/2007 Document Revised: 05/12/2016 Document Reviewed: 05/12/2016 Elsevier Interactive Patient Education  2017 [employer].

## 2016-10-05 ENCOUNTER — Encounter: Payer: Self-pay | Admitting: Internal Medicine

## 2016-10-05 LAB — CBC WITH DIFFERENTIAL/PLATELET
Basophils Absolute: 0 10*3/uL (ref 0.0–0.2)
Basos: 0 %
EOS (ABSOLUTE): 0.2 10*3/uL (ref 0.0–0.4)
Eos: 1 %
Hematocrit: 44.2 % (ref 37.5–51.0)
Hemoglobin: 14.6 g/dL (ref 13.0–17.7)
Immature Grans (Abs): 0 10*3/uL (ref 0.0–0.1)
Immature Granulocytes: 0 %
Lymphocytes Absolute: 4.2 10*3/uL — ABNORMAL HIGH (ref 0.7–3.1)
Lymphs: 31 %
MCH: 29 pg (ref 26.6–33.0)
MCHC: 33 g/dL (ref 31.5–35.7)
MCV: 88 fL (ref 79–97)
Monocytes Absolute: 0.9 10*3/uL (ref 0.1–0.9)
Monocytes: 7 %
Neutrophils Absolute: 8.3 10*3/uL — ABNORMAL HIGH (ref 1.4–7.0)
Neutrophils: 61 %
Platelets: 243 10*3/uL (ref 150–379)
RBC: 5.04 x10E6/uL (ref 4.14–5.80)
RDW: 15.7 % — ABNORMAL HIGH (ref 12.3–15.4)
WBC: 13.6 10*3/uL — ABNORMAL HIGH (ref 3.4–10.8)

## 2016-10-05 LAB — BASIC METABOLIC PANEL
BUN/Creatinine Ratio: 9 (ref 9–20)
BUN: 11 mg/dL (ref 6–24)
CO2: 27 mmol/L (ref 18–29)
Calcium: 9.1 mg/dL (ref 8.7–10.2)
Chloride: 102 mmol/L (ref 96–106)
Creatinine, Ser: 1.2 mg/dL (ref 0.76–1.27)
GFR calc Af Amer: 81 mL/min/{1.73_m2} (ref >59)
GFR calc non Af Amer: 70 mL/min/{1.73_m2} (ref >59)
Glucose: 109 mg/dL — ABNORMAL HIGH (ref 65–99)
Potassium: 4.2 mmol/L (ref 3.5–5.2)
Sodium: 143 mmol/L (ref 134–144)

## 2016-10-05 LAB — MICROALBUMIN / CREATININE URINE RATIO
Creatinine, Urine: 215.9 mg/dL
Microalb/Creat Ratio: 8.4 mg/g creat (ref 0.0–30.0)
Microalbumin, Urine: 18.2 ug/mL

## 2016-10-05 NOTE — Telephone Encounter (Signed)
Will forward to pcp

## 2016-10-07 ENCOUNTER — Telehealth: Payer: Self-pay | Admitting: Internal Medicine

## 2016-10-07 ENCOUNTER — Telehealth: Payer: Self-pay

## 2016-10-07 NOTE — Telephone Encounter (Signed)
Contacted pt to go over lab results pt didn't answer and was unable to lvm  

## 2016-10-07 NOTE — Telephone Encounter (Signed)
Sent results out

## 2016-10-07 NOTE — Telephone Encounter (Signed)
Returned pt call pt didn't answer lvm  

## 2016-10-07 NOTE — Telephone Encounter (Signed)
Patient returning call.

## 2016-10-07 NOTE — Telephone Encounter (Signed)
Patient returned nurse' call regarding lab results. Please follow up.  Thank you.

## 2016-10-18 ENCOUNTER — Ambulatory Visit: Payer: Self-pay | Attending: Internal Medicine | Admitting: *Deleted

## 2016-10-18 DIAGNOSIS — I10 Essential (primary) hypertension: Secondary | ICD-10-CM | POA: Insufficient documentation

## 2016-11-05 ENCOUNTER — Ambulatory Visit: Payer: Self-pay | Attending: Internal Medicine | Admitting: *Deleted

## 2016-11-05 VITALS — BP 122/70 | HR 106 | Resp 16

## 2016-11-05 DIAGNOSIS — I10 Essential (primary) hypertension: Secondary | ICD-10-CM | POA: Insufficient documentation

## 2016-11-05 DIAGNOSIS — Z0130 Encounter for examination of blood pressure without abnormal findings: Secondary | ICD-10-CM

## 2016-11-05 NOTE — Progress Notes (Signed)
Pt here for f/u BP check after office visit on  10/07/2016 with Dr. Janne Napoleon. Pt denies chest pain, SOB, HA, new vison concerns, or generalized swelling.  After medications were reviewed pt states he has not been taking medication. He was encourage to  Return to office in 2 weeks while being on medication. Pt  verbalized understanding.  Schedule appointment for patient to return.

## 2016-11-05 NOTE — Progress Notes (Signed)
Pt arrived to [facility]. Pt alert and oriented and arrives in good spirits. Last OV 10/04/2016  with Dr. Janne Napoleon.  Pt denies chest pain, SOB, HA, dizziness, or blurred vision.  Verified medication. Pt states medication was taken this morning.  Manual blood pressure reading: 122/70

## 2016-11-11 ENCOUNTER — Encounter: Payer: Self-pay | Admitting: Internal Medicine

## 2016-11-12 ENCOUNTER — Encounter: Payer: Self-pay | Admitting: Internal Medicine

## 2016-11-15 ENCOUNTER — Encounter: Payer: Self-pay | Admitting: Internal Medicine

## 2016-11-24 ENCOUNTER — Ambulatory Visit (AMBULATORY_SURGERY_CENTER): Payer: Self-pay | Admitting: *Deleted

## 2016-11-24 VITALS — Ht 64.0 in | Wt 220.0 lb

## 2016-11-24 DIAGNOSIS — Z1211 Encounter for screening for malignant neoplasm of colon: Secondary | ICD-10-CM

## 2016-11-24 MED ORDER — BISACODYL 5 MG PO TBEC: DELAYED_RELEASE_TABLET | ORAL | 0 refills | Status: DC

## 2016-11-24 MED ORDER — POLYETHYLENE GLYCOL 3350 17 GM/SCOOP PO POWD: ORAL | 0 refills | Status: DC

## 2016-11-24 MED FILL — POLYETHYLENE GLYCOL 3350 PO: 1 days supply | Qty: 255 | Fill #0

## 2016-11-24 NOTE — Progress Notes (Signed)
Patient denies any allergies to eggs or soy. Patient denies any problems with anesthesia/sedation. Patient denies any oxygen use at home and does not take any diet/weight loss medications. No email per pt. No insurance and no sample prep available here in pv or with 3rd floor. Miralax/dulcolax prep given to patient and sent to his pharmacy.

## 2016-12-07 ENCOUNTER — Ambulatory Visit (AMBULATORY_SURGERY_CENTER): Payer: Self-pay | Admitting: Internal Medicine

## 2016-12-07 ENCOUNTER — Encounter: Payer: Self-pay | Admitting: Internal Medicine

## 2016-12-07 VITALS — BP 132/84 | HR 79 | Temp 99.8°F | Resp 20 | Ht 64.0 in | Wt 225.0 lb

## 2016-12-07 DIAGNOSIS — Z1211 Encounter for screening for malignant neoplasm of colon: Secondary | ICD-10-CM

## 2016-12-07 DIAGNOSIS — D129 Benign neoplasm of anus and anal canal: Secondary | ICD-10-CM

## 2016-12-07 DIAGNOSIS — Z1212 Encounter for screening for malignant neoplasm of rectum: Secondary | ICD-10-CM

## 2016-12-07 DIAGNOSIS — K621 Rectal polyp: Secondary | ICD-10-CM

## 2016-12-07 DIAGNOSIS — D128 Benign neoplasm of rectum: Secondary | ICD-10-CM

## 2016-12-07 MED ORDER — SODIUM CHLORIDE 0.9 % IV SOLN: 500.0000 mL | INTRAVENOUS | Status: DC

## 2016-12-07 NOTE — Op Note (Signed)
Jorge [name] Patient Name: Jorge [name] Procedure Date: 12/07/2016 10:26 AM MRN: [id number] Endoscopist: Jerene Bears , MD Age: 51 Referring MD:  Date of Birth: December 08, 1965 Gender: Male Account #: [id number] Procedure:                Colonoscopy Indications:              Screening for colorectal malignant neoplasm, This                            is the patient's first colonoscopy Medicines:                Monitored Anesthesia Care Procedure:                Pre-Anesthesia Assessment:                           - Prior to the procedure, a History and Physical                            was performed, and patient medications and                            allergies were reviewed. The patient's tolerance of                            previous anesthesia was also reviewed. The risks                            and benefits of the procedure and the sedation                            options and risks were discussed with the patient.                            All questions were answered, and informed consent                            was obtained. Prior Anticoagulants: The patient has                            taken no previous anticoagulant or antiplatelet                            agents. ASA Grade Assessment: II - A patient with                            mild systemic disease. After reviewing the risks                            and benefits, the patient was deemed in                            satisfactory condition to undergo the procedure.  After obtaining informed consent, the colonoscope                            was passed under direct vision. Throughout the                            procedure, the patient's blood pressure, pulse, and                            oxygen saturations were monitored continuously. The                            Colonoscope was introduced through the anus and                            advanced to the the cecum,  identified by                            appendiceal orifice and ileocecal valve. The                            colonoscopy was performed without difficulty. The                            patient tolerated the procedure well. The quality                            of the bowel preparation was good. The ileocecal                            valve, appendiceal orifice, and rectum were                            photographed. Scope In: 10:33:56 AM Scope Out: 10:50:20 AM Scope Withdrawal Time: 0 hours 12 minutes 15 seconds  Total Procedure Duration: 0 hours 16 minutes 24 seconds  Findings:                 The digital rectal exam was normal.                           Two sessile polyps were found in the rectum. The                            polyps were 3 to 4 mm in size. These polyps were                            removed with a cold snare. Resection and retrieval                            were complete.                           Multiple small and large-mouthed diverticula were  found in the sigmoid colon and descending colon.                           Internal hemorrhoids were found during                            retroflexion. The hemorrhoids were small. Complications:            No immediate complications. Estimated Blood Loss:     Estimated blood loss was minimal. Impression:               - Two 3 to 4 mm polyps in the rectum, removed with                            a cold snare. Resected and retrieved.                           - Moderate diverticulosis in the sigmoid colon and                            in the descending colon.                           - Internal hemorrhoids. Recommendation:           - Patient has a contact number available for                            emergencies. The signs and symptoms of potential                            delayed complications were discussed with the                            patient. Return to normal  activities tomorrow.                            Written discharge instructions were provided to the                            patient.                           - Resume previous diet.                           - Continue present medications.                           - Await pathology results.                           - Repeat colonoscopy is recommended. The                            colonoscopy date will be determined after pathology  results from today's exam become available for                            review. Jerene Bears, MD 12/07/2016 10:53:41 AM This report has been signed electronically.

## 2016-12-07 NOTE — Progress Notes (Signed)
Spontaneous respirations throughout. VSS. Resting comfortably. To PACU on room air. Report to  Michelle RN. 

## 2016-12-07 NOTE — Progress Notes (Signed)
Pt's states no medical or surgical changes since previsit or office visit. 

## 2016-12-07 NOTE — Progress Notes (Signed)
Called to room to assist during endoscopic procedure.  Patient ID and intended procedure confirmed with present staff. Received instructions for my participation in the procedure from the performing physician.  

## 2016-12-07 NOTE — Patient Instructions (Signed)
YOU HAD AN ENDOSCOPIC PROCEDURE TODAY AT THE [facility]:   Refer to the procedure report that was given to you for any specific questions about what was found during the examination.  If the procedure report does not answer your questions, please call your gastroenterologist to clarify.  If you requested that your care partner not be given the details of your procedure findings, then the procedure report has been included in a sealed envelope for you to review at your convenience later.  YOU SHOULD EXPECT: Some feelings of bloating in the abdomen. Passage of more gas than usual.  Walking can help get rid of the air that was put into your GI tract during the procedure and reduce the bloating. If you had a lower endoscopy (such as a colonoscopy or flexible sigmoidoscopy) you may notice spotting of blood in your stool or on the toilet paper. If you underwent a bowel prep for your procedure, you may not have a normal bowel movement for a few days.  Please Note:  You might notice some irritation and congestion in your nose or some drainage.  This is from the oxygen used during your procedure.  There is no need for concern and it should clear up in a day or so.  SYMPTOMS TO REPORT IMMEDIATELY:   Following lower endoscopy (colonoscopy or flexible sigmoidoscopy):  Excessive amounts of blood in the stool  Significant tenderness or worsening of abdominal pains  Swelling of the abdomen that is new, acute  Fever of 100F or higher  For urgent or emergent issues, a gastroenterologist can be reached at any hour by calling [phone].   DIET:  We do recommend a small meal at first, but then you may proceed to your regular diet.  Drink plenty of fluids but you should avoid alcoholic beverages for 24 hours.  ACTIVITY:  You should plan to take it easy for the rest of today and you should NOT DRIVE or use heavy machinery until tomorrow (because of the sedation medicines used during the test).     FOLLOW UP: Our staff will call the number listed on your records the next business day following your procedure to check on you and address any questions or concerns that you may have regarding the information given to you following your procedure. If we do not reach you, we will leave a message.  However, if you are feeling well and you are not experiencing any problems, there is no need to return our call.  We will assume that you have returned to your regular daily activities without incident.  If any biopsies were taken you will be contacted by phone or by letter within the next 1-3 weeks.  Please call us at [phone] if you have not heard about the biopsies in 3 weeks.   Await for biopsy results to determine next repeat Colonoscopy screening Polyps (handout given) Diverticulosis (handout given) Hemorrhoids (handout given)   SIGNATURES/CONFIDENTIALITY: You and/or your care partner have signed paperwork which will be entered into your electronic medical record.  These signatures attest to the fact that that the information above on your After Visit Summary has been reviewed and is understood.  Full responsibility of the confidentiality of this discharge information lies with you and/or your care-partner. 

## 2016-12-08 ENCOUNTER — Telehealth: Payer: Self-pay

## 2016-12-08 ENCOUNTER — Telehealth: Payer: Self-pay | Admitting: *Deleted

## 2016-12-08 NOTE — Telephone Encounter (Signed)
Left message on answering machine. 

## 2016-12-08 NOTE — Telephone Encounter (Signed)
  Follow up Call-  Call back number 12/07/2016  Post procedure Call Back phone  # [phone]  Permission to leave phone message Yes  Some recent data might be hidden     Patient questions:  Do you have a fever, pain , or abdominal swelling? No. Pain Score  0 *  Have you tolerated food without any problems? Yes.    Have you been able to return to your normal activities? Yes.    Do you have any questions about your discharge instructions: Diet   No. Medications  No. Follow up visit  No.  Do you have questions or concerns about your Care? No.  Actions: * If pain score is 4 or above: No action needed, pain <4.

## 2016-12-13 ENCOUNTER — Encounter: Payer: Self-pay | Admitting: Internal Medicine

## 2017-05-11 ENCOUNTER — Ambulatory Visit: Payer: Self-pay | Attending: Internal Medicine

## 2017-09-26 ENCOUNTER — Other Ambulatory Visit: Payer: Self-pay

## 2017-09-26 ENCOUNTER — Emergency Department (HOSPITAL_COMMUNITY): Payer: Self-pay

## 2017-09-26 ENCOUNTER — Encounter (HOSPITAL_COMMUNITY): Payer: Self-pay

## 2017-09-26 ENCOUNTER — Emergency Department (HOSPITAL_COMMUNITY)
Admission: EM | Admit: 2017-09-26 | Discharge: 2017-09-26 | Disposition: A | Discharge status: Home / Self Care | Payer: Self-pay | Attending: Emergency Medicine | Admitting: Emergency Medicine

## 2017-09-26 DIAGNOSIS — M79671 Pain in right foot: Secondary | ICD-10-CM | POA: Insufficient documentation

## 2017-09-26 DIAGNOSIS — F17210 Nicotine dependence, cigarettes, uncomplicated: Secondary | ICD-10-CM | POA: Insufficient documentation

## 2017-09-26 MED ORDER — IBUPROFEN 400 MG PO TABS
400.0000 mg | ORAL_TABLET | Freq: Once | ORAL | Status: AC
  Administered 2017-09-26: 400 mg via ORAL
  Filled 2017-09-26: qty 1

## 2017-09-26 NOTE — ED Triage Notes (Signed)
Pt reports right foot pain X3 days. He reports he stepped on his foot wrong and believes he sprained his foot.

## 2017-09-26 NOTE — ED Provider Notes (Signed)
Jorge [name] Provider Note   CSN: [id number] Arrival date & time: 09/26/17  0915     History   Chief Complaint Chief Complaint  Patient presents with  . Foot Pain    HPI  Blood pressure (!) 152/99, pulse 93, temperature 98.6 F (37 C), temperature source Oral, resp. rate 16, SpO2 100 %.  Jorge [name] is a 52 y.o. male complaining of right foot pain after he stepped into a hole on his lawn last Thursday.  He has been ambulatory but with pain, he states the pain is alleviated by nonweightbearing status in addition to taking 400 mg of Motrin.  He did not have any pain medication this morning.  Past Medical History:  Diagnosis Date  . Allergy   . Environmental allergies   . Sinus infection     There are no active problems to display for this patient.   Past Surgical History:  Procedure Laterality Date  . NO PAST SURGERIES          Home Medications    Prior to Admission medications   Medication Sig Start Date End Date Taking? Authorizing Provider  Blood Glucose Monitoring Suppl (TRUE METRIX GO GLUCOSE METER) w/Device KIT 1 each by Does not apply route every 8 (eight) hours as needed. Patient not taking: Reported on 12/07/2016 10/04/16   Maren Reamer, MD  cyclobenzaprine (FLEXERIL) 10 MG tablet Take 1 tablet (10 mg total) by mouth at bedtime. Patient not taking: Reported on 11/24/2016 08/12/16   Clent Demark, PA-C  glucose blood test strip Use as instructed 10/04/16   Lottie Mussel T, MD  lisinopril-hydrochlorothiazide (PRINZIDE,ZESTORETIC) 10-12.5 MG tablet Take 1 tablet by mouth daily. Patient not taking: Reported on 11/24/2016 10/04/16   Maren Reamer, MD  metFORMIN (GLUCOPHAGE) 500 MG tablet Take 1 tablet (500 mg total) by mouth 2 (two) times daily with a meal. Patient not taking: Reported on 11/24/2016 10/04/16   Maren Reamer, MD  naproxen (NAPROSYN) 500 MG tablet Take 1 tablet (500 mg total) by mouth 2 (two) times  daily with a meal. Patient not taking: Reported on 11/24/2016 08/12/16   Clent Demark, PA-C  sodium chloride (OCEAN) 0.65 % SOLN nasal spray Place 1 spray into both nostrils as needed for congestion. 08/12/16   Clent Demark, PA-C  TRUEPLUS LANCETS 26G MISC 1 each by Does not apply route every 8 (eight) hours as needed. Patient not taking: Reported on 12/07/2016 10/04/16   Maren Reamer, MD    Family History Family History  Problem Relation Age of Onset  . Ovarian cancer Mother   . Diabetes Father   . Colon cancer Neg Hx     Social History Social History   Tobacco Use  . Smoking status: Current Every Day Smoker    Packs/day: 0.25    Types: Cigarettes  . Smokeless tobacco: Never Used  Substance Use Topics  . Alcohol use: Yes    Comment: beer 1-2 times per month per pt.  . Drug use: Yes    Types: Marijuana    Comment: daily per pt last 12/05/16     Allergies   Peanut-containing drug products   Review of Systems Review of Systems  A complete review of systems was obtained and all systems are negative except as noted in the HPI and PMH.   Physical Exam Updated Vital Signs BP (!) 152/99 (BP Location: Right Arm)   Pulse 93   Temp 98.6 F (37  C) (Oral)   Resp 16   SpO2 100%   Physical Exam  Constitutional: He is oriented to person, place, and time. He appears well-developed and well-nourished. No distress.  HENT:  Head: Normocephalic and atraumatic.  Mouth/Throat: Oropharynx is clear and moist.  Eyes: Pupils are equal, round, and reactive to light. Conjunctivae and EOM are normal.  Neck: Normal range of motion.  Cardiovascular: Normal rate, regular rhythm and intact distal pulses.  Pulmonary/Chest: Effort normal and breath sounds normal.  Abdominal: Soft. There is no tenderness.  Musculoskeletal: Normal range of motion. He exhibits no edema or tenderness.  No edema or deformity to right foot, excellent range of motion to toes, no focal bony tenderness he  is diffusely tender to palpation along the dorsum of the foot, excellent range of motion.  Distally neurovascularly intact.  Neurological: He is alert and oriented to person, place, and time.  Skin: He is not diaphoretic.  Psychiatric: He has a normal mood and affect.  Nursing note and vitals reviewed.    ED Treatments / Results  Labs (all labs ordered are listed, but only abnormal results are displayed) Labs Reviewed - No data to display  EKG None  Radiology Dg Foot Complete Right  Result Date: 09/26/2017 CLINICAL DATA:  Pain centered over the dorsal medial aspect of the foot after stepping wrong in the yard yesterday. EXAM: RIGHT FOOT COMPLETE - 3+ VIEW COMPARISON:  None in PACs FINDINGS: The bones are subjectively adequately mineralized. There is no acute or healing fracture. There is soft tissue swelling over the midfoot. The joint spaces are reasonably well-maintained. IMPRESSION: There is no acute fracture nor dislocation of the bones of the right foot. Electronically Signed   By: David  Martinique M.D.   On: 09/26/2017 10:18    Procedures Procedures (including critical care time)  Medications Ordered in ED Medications  ibuprofen (ADVIL,MOTRIN) tablet 400 mg (400 mg Oral Given 09/26/17 1100)     Initial Impression / Assessment and Plan / ED Course  I have reviewed the triage vital signs and the nursing notes.  Pertinent labs & imaging results that were available during my care of the patient were reviewed by me and considered in my medical decision making (see chart for details).     Vitals:   09/26/17 0926  BP: (!) 152/99  Pulse: 93  Resp: 16  Temp: 98.6 F (37 C)  TempSrc: Oral  SpO2: 100%    Medications  ibuprofen (ADVIL,MOTRIN) tablet 400 mg (400 mg Oral Given 09/26/17 1100)    Jorge [name] is 52 y.o. male presenting with pain to right foot after stepping in hole several days ago.  Physical exam reassuring, x-ray negative.  Likely sprain.  Patient given  crutches, Ace wrap, recommend rest, ice, compression and elevation.  Work note provided.  Evaluation does not show pathology that would require ongoing emergent intervention or inpatient treatment. Pt is hemodynamically stable and mentating appropriately. Discussed findings and plan with patient/guardian, who agrees with care plan. All questions answered. Return precautions discussed and outpatient follow up given.      Final Clinical Impressions(s) / ED Diagnoses   Final diagnoses:  Foot pain, right    ED Discharge Orders    None       Victorina Kable, Charna Elizabeth 09/26/17 1102    Carmin Muskrat, MD 09/26/17 [phone]

## 2017-09-26 NOTE — ED Notes (Signed)
Patient transported to X-ray 

## 2017-09-26 NOTE — Discharge Instructions (Signed)
Rest, Ice intermittently (in the first 24-48 hours), Gentle compression with an Ace wrap, and elevate (Limb above the level of the heart)   Take up to 800mg of ibuprofen (that is usually 4 over the counter pills)  3 times a day for 5 days. Take with food.  

## 2017-10-05 ENCOUNTER — Ambulatory Visit: Payer: Self-pay | Attending: Family Medicine

## 2018-09-06 ENCOUNTER — Ambulatory Visit: Payer: Self-pay | Attending: Nurse Practitioner | Admitting: Nurse Practitioner

## 2018-09-06 ENCOUNTER — Encounter: Payer: Self-pay | Admitting: Internal Medicine

## 2018-09-06 ENCOUNTER — Encounter: Payer: Self-pay | Admitting: Nurse Practitioner

## 2018-09-06 ENCOUNTER — Other Ambulatory Visit: Payer: Self-pay

## 2018-09-06 VITALS — BP 159/90 | HR 73 | Temp 98.8°F | Ht 64.75 in | Wt 222.0 lb

## 2018-09-06 DIAGNOSIS — M545 Low back pain, unspecified: Secondary | ICD-10-CM

## 2018-09-06 DIAGNOSIS — J301 Allergic rhinitis due to pollen: Secondary | ICD-10-CM | POA: Insufficient documentation

## 2018-09-06 DIAGNOSIS — R7303 Prediabetes: Secondary | ICD-10-CM | POA: Insufficient documentation

## 2018-09-06 DIAGNOSIS — I10 Essential (primary) hypertension: Secondary | ICD-10-CM | POA: Insufficient documentation

## 2018-09-06 LAB — POCT GLYCOSYLATED HEMOGLOBIN (HGB A1C): Hemoglobin A1C: 6.3 % — AB (ref 4.0–5.6)

## 2018-09-06 LAB — GLUCOSE, POCT (MANUAL RESULT ENTRY): POC Glucose: 140 mg/dl — AB (ref 70–99)

## 2018-09-06 MED ORDER — LISINOPRIL-HYDROCHLOROTHIAZIDE 10-12.5 MG PO TABS: 1.0000 | ORAL_TABLET | Freq: Every day | ORAL | 3 refills | Status: DC

## 2018-09-06 MED ORDER — GLUCOSE BLOOD VI STRP: ORAL_STRIP | 12 refills | Status: DC

## 2018-09-06 MED ORDER — NAPROXEN 500 MG PO TABS: 500.0000 mg | ORAL_TABLET | Freq: Two times a day (BID) | ORAL | 0 refills | Status: AC

## 2018-09-06 MED ORDER — TIZANIDINE HCL 4 MG PO TABS: 4.0000 mg | ORAL_TABLET | Freq: Four times a day (QID) | ORAL | 0 refills | Status: AC | PRN

## 2018-09-06 MED ORDER — METFORMIN HCL 500 MG PO TABS: 500.0000 mg | ORAL_TABLET | Freq: Two times a day (BID) | ORAL | 3 refills | Status: DC

## 2018-09-06 MED ORDER — AZELASTINE HCL 0.1 % NA SOLN: 2.0000 | Freq: Two times a day (BID) | NASAL | 12 refills | Status: AC

## 2018-09-06 MED ORDER — CETIRIZINE HCL 10 MG PO TABS: 10.0000 mg | ORAL_TABLET | Freq: Every day | ORAL | 11 refills | Status: DC

## 2018-09-06 MED ORDER — TRUEPLUS LANCETS 26G MISC: 1.0000 | Freq: Three times a day (TID) | 12 refills | Status: DC | PRN

## 2018-09-06 MED FILL — NAPROXEN 500 MG TABLET: 500 | 15 days supply | Qty: 30 | Fill #0

## 2018-09-06 MED FILL — LISINOPRIL-HCTZ 10-12.5 MG: 10-12.5 | 30 days supply | Qty: 30 | Fill #0

## 2018-09-06 MED FILL — tiZANidine HCL 4 MG TABS: 4 | 15 days supply | Qty: 60 | Fill #0

## 2018-09-06 MED FILL — metFORMIN HCL 500 MG TABS: 500 | 30 days supply | Qty: 60 | Fill #0

## 2018-09-06 NOTE — Progress Notes (Signed)
Assessment & Plan:  Jorge [name] was seen today for establish care.  Diagnoses and all orders for this visit:  Prediabetes -     Glucose (CBG) -     HgB A1c -     TSH -     metFORMIN (GLUCOPHAGE) 500 MG tablet; Take 1 tablet (500 mg total) by mouth 2 (two) times daily with a meal. -     glucose blood test strip; Use as instructed -     TRUEplus Lancets 26G MISC; 1 each by Does not apply route every 8 (eight) hours as needed. Continue blood sugar control as discussed in office today, low carbohydrate diet, and regular physical exercise as tolerated, 150 minutes per week (30 min each day, 5 days per week, or 50 min 3 days per week).   Essential hypertension -     CBC -     Basic metabolic panel -     Lipid panel -     lisinopril-hydrochlorothiazide (PRINZIDE,ZESTORETIC) 10-12.5 MG tablet; Take 1 tablet by mouth daily. Continue all antihypertensives as prescribed.  Remember to bring in your blood pressure log with you for your follow up appointment.  DASH/Mediterranean Diets are healthier choices for HTN.    Acute left-sided low back pain without sciatica -     naproxen (NAPROSYN) 500 MG tablet; Take 1 tablet (500 mg total) by mouth 2 (two) times daily with a meal for 15 days. -     tiZANidine (ZANAFLEX) 4 MG tablet; Take 1 tablet (4 mg total) by mouth every 6 (six) hours as needed for up to 15 days for muscle spasms. Work on losing weight to help reduce back pain. May alternate with heat and ice application for pain relief. May also alternate with acetaminophen  as prescribed for back pain. Other alternatives include massage, acupuncture and water aerobics.  You must stay active and avoid a sedentary lifestyle.    Non-seasonal allergic rhinitis due to pollen -     azelastine (ASTELIN) 0.1 % nasal spray; Place 2 sprays into both nostrils 2 (two) times daily for 30 days. Use in each nostril as directed -     cetirizine (ZYRTEC) 10 MG tablet; Take 1 tablet (10 mg total) by mouth daily.    Patient has been counseled on age-appropriate routine health concerns for screening and prevention. These are reviewed and up-to-date. Referrals have been placed accordingly. Immunizations are up-to-date or declined.    Subjective:   Chief Complaint  Patient presents with  . Establish Care    Pt. is here to establish care. Pt. stated his back pain is like throbbing.    HPI Jorge [name] 53 y.o. male presents to office today to establish care. PMH: tobacco dependence, essential Hypertension, DM TYPE 2, and allergic rhinitis.   He has been out of medications for almost 2 years. He has complaints of acute back pain today.   Essential Hypertension Chronic and poorly controlled today. Will refill lisinopril-hctz 10-12.56m today. His blood pressure was reasonably controlled in the past on this dosage. Will have him return in 2-3 weeks for BP recheck. He currently denies chest pain, shortness of breath, palpitations, lightheadedness, dizziness, headaches or BLE edema.  BP Readings from Last 3 Encounters:  09/06/18 (!) 159/90  09/26/17 (!) 152/99  12/07/16 132/84     Prediabetes Chronic and well controlled although he reports not taking metformin 500 mg 2 times daily in over a year.  He currently denies any hypo-or hyperglycemic symptoms.  Will resume metformin  as previously prescribed.  We discussed dietary and exercise modifications today in regard to tight diabetes control.  Patient does not monitor his blood pressure at home.  Highest A1c 6.7. Lab Results  Component Value Date   HGBA1C 6.3 (A) 09/06/2018     Back Pain Acute onset 4 days ago.  "I got to praising too hard at chuch on Sunday". He has had back pain in the past "but not this bad". Aggravating factors: reaching, taking a deep breath.  He denies any chest pain, GU symptoms or bladder or bowel incontinence.  Pain is mostly localized to the left lower lumbar area. He took OTC ibuprofen 200 mg a few times over the past 4 days.  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            Review of Systems  Constitutional: Negative for fever, malaise/fatigue and weight loss.  HENT: Positive for congestion. Negative for nosebleeds, sinus pain and sore throat.   Eyes: Positive for redness. Negative for blurred vision, double vision and photophobia.  Respiratory: Negative.  Negative for cough and shortness of breath.   Cardiovascular: Negative.  Negative for chest pain, palpitations and leg swelling.  Gastrointestinal: Negative.  Negative for heartburn, nausea and vomiting.  Musculoskeletal: Positive for back pain and myalgias. Negative for falls.  Neurological: Negative.  Negative for dizziness, focal weakness, seizures and headaches.  Endo/Heme/Allergies: Positive for environmental allergies.  Psychiatric/Behavioral: Negative.  Negative for suicidal ideas.    Past Medical History:  Diagnosis Date  . Allergy   . Environmental allergies   . Sinus infection     Past Surgical History:  Procedure Laterality Date  . NO PAST SURGERIES      Family History  Problem Relation Age of Onset  . Ovarian cancer Mother   . Diabetes Father   . Colon cancer Neg Hx     Social History Reviewed with no changes to be made today.   Outpatient Medications Prior to Visit  Medication Sig Dispense Refill  . Blood Glucose Monitoring Suppl (TRUE METRIX GO GLUCOSE METER) w/Device KIT 1 each by Does not apply route every 8 (eight) hours as needed. (Patient not taking: Reported on 12/07/2016) 1 kit 0  . cyclobenzaprine (FLEXERIL) 10 MG tablet Take 1 tablet (10 mg total) by mouth at bedtime. (Patient not taking: Reported on 11/24/2016) 10 tablet 0  . glucose blood test strip Use as instructed (Patient not taking: Reported on 09/06/2018) 100 each 12  . lisinopril-hydrochlorothiazide (PRINZIDE,ZESTORETIC) 10-12.5 MG tablet Take 1 tablet by mouth daily. (Patient not taking:  Reported on 11/24/2016) 90 tablet 3  . metFORMIN (GLUCOPHAGE) 500 MG tablet Take 1 tablet (500 mg total) by mouth 2 (two) times daily with a meal. (Patient not taking: Reported on 11/24/2016) 60 tablet 3  . naproxen (NAPROSYN) 500 MG tablet Take 1 tablet (500 mg total) by mouth 2 (two) times daily with a meal. (Patient not taking: Reported on 11/24/2016) 30 tablet 0  . sodium chloride (OCEAN) 0.65 % SOLN nasal spray Place 1 spray into both nostrils as needed for congestion. (Patient not taking: Reported on 09/06/2018) 1 Bottle 0  . TRUEPLUS LANCETS 26G MISC 1 each by Does not apply route every 8 (eight) hours as needed. (Patient not taking: Reported on 12/07/2016) 100 each 12   Facility-Administered Medications Prior to Visit  Medication Dose Route Frequency Provider Last Rate Last Dose  . 0.9 %  sodium chloride infusion  500 mL Intravenous Continuous Pyrtle, Lajuan Lines, MD        Allergies  Allergen Reactions  . Peanut-Containing Drug Products Anaphylaxis       Objective:    BP (!) 159/90 (BP Location: Right Arm, Patient Position: Sitting, Cuff Size: Large)   Pulse 73   Temp 98.8 F (37.1 C) (Oral)   Ht 5' 4.75" (1.645 m)   Wt 222 lb (100.7 kg)   SpO2 96%   BMI 37.23 kg/m  Wt Readings from Last 3 Encounters:  09/06/18 222 lb (100.7 kg)  12/07/16 225 lb (102.1 kg)  11/24/16 220 lb (99.8 kg)    Physical Exam Vitals signs and nursing note reviewed.  Constitutional:      Appearance: He is well-developed.  HENT:     Head: Normocephalic and atraumatic.     Nose: Mucosal edema, congestion and rhinorrhea present.     Right Turbinates: Enlarged, swollen and pale.     Left Turbinates: Enlarged, swollen and pale.     Right Sinus: No maxillary sinus tenderness or frontal sinus tenderness.     Left Sinus: No maxillary sinus tenderness or frontal sinus tenderness.  Neck:     Musculoskeletal: Normal range of motion.  Cardiovascular:     Rate and Rhythm: Normal rate and regular rhythm.      Heart sounds: Normal heart sounds. No murmur. No friction rub. No gallop.   Pulmonary:     Effort: Pulmonary effort is normal. No tachypnea or respiratory distress.     Breath sounds: Normal breath sounds. No decreased breath sounds, wheezing, rhonchi or rales.  Chest:     Chest wall: No tenderness.  Abdominal:     General: Bowel sounds are normal.     Palpations: Abdomen is soft.  Musculoskeletal: Normal range of motion.     Lumbar back: He exhibits tenderness and spasm. He exhibits no swelling and no edema.       Back:  Skin:    General: Skin is warm and dry.  Neurological:     Mental Status: He is alert and oriented to person, place, and time.     Coordination: Coordination normal.  Psychiatric:        Behavior: Behavior normal. Behavior is cooperative.        Thought Content: Thought content normal.        Judgment: Judgment normal.        Patient has been counseled extensively about nutrition and exercise as well as the importance of adherence with medications and regular follow-up. The patient was given clear instructions to go to ER or return to medical center if symptoms don't improve, worsen or new problems develop. The patient verbalized understanding.   Follow-up: Return in about 3 weeks (around 09/27/2018) for BP RECHECK with LUKE needs a friday.   Gildardo Pounds, FNP-BC [facility] [place], [place]   09/06/2018, 2:03 PM

## 2018-09-07 ENCOUNTER — Other Ambulatory Visit: Payer: Self-pay | Admitting: Nurse Practitioner

## 2018-09-07 LAB — BASIC METABOLIC PANEL
BUN/Creatinine Ratio: 8 — ABNORMAL LOW (ref 9–20)
BUN: 11 mg/dL (ref 6–24)
CO2: 26 mmol/L (ref 20–29)
Calcium: 9.4 mg/dL (ref 8.7–10.2)
Chloride: 105 mmol/L (ref 96–106)
Creatinine, Ser: 1.36 mg/dL — ABNORMAL HIGH (ref 0.76–1.27)
GFR calc Af Amer: 69 mL/min/{1.73_m2} (ref >59)
GFR calc non Af Amer: 59 mL/min/{1.73_m2} — ABNORMAL LOW (ref >59)
Glucose: 115 mg/dL — ABNORMAL HIGH (ref 65–99)
Potassium: 4.1 mmol/L (ref 3.5–5.2)
Sodium: 144 mmol/L (ref 134–144)

## 2018-09-07 LAB — LIPID PANEL
Chol/HDL Ratio: 4.7 ratio (ref 0.0–5.0)
Cholesterol, Total: 187 mg/dL (ref 100–199)
HDL: 40 mg/dL (ref >39)
LDL Calculated: 91 mg/dL (ref 0–99)
Triglycerides: 278 mg/dL — ABNORMAL HIGH (ref 0–149)
VLDL Cholesterol Cal: 56 mg/dL — ABNORMAL HIGH (ref 5–40)

## 2018-09-07 LAB — CBC
Hematocrit: 43.7 % (ref 37.5–51.0)
Hemoglobin: 15 g/dL (ref 13.0–17.7)
MCH: 30.2 pg (ref 26.6–33.0)
MCHC: 34.3 g/dL (ref 31.5–35.7)
MCV: 88 fL (ref 79–97)
Platelets: 211 10*3/uL (ref 150–450)
RBC: 4.97 x10E6/uL (ref 4.14–5.80)
RDW: 14.5 % (ref 11.6–15.4)
WBC: 11.5 10*3/uL — ABNORMAL HIGH (ref 3.4–10.8)

## 2018-09-07 LAB — TSH: TSH: 1.72 u[IU]/mL (ref 0.450–4.500)

## 2018-09-07 MED ORDER — ATORVASTATIN CALCIUM 40 MG PO TABS: 40.0000 mg | ORAL_TABLET | Freq: Every day | ORAL | 3 refills | Status: DC

## 2018-09-08 ENCOUNTER — Telehealth: Payer: Self-pay

## 2018-09-08 NOTE — Telephone Encounter (Signed)
Patient returned the call.  Pt. Was inform on lab results and aware of Rx for cholesterol was sent.  Pt. Scheduled a lab appt on 09/29/2018.

## 2018-09-08 NOTE — Telephone Encounter (Signed)
-----   Message from Gildardo Pounds, NP sent at 09/07/2018 11:28 PM EDT ----- Kidney function has decreased. WIll continue to monitor at this time. Avoid NSAIDs such as ibuprofen, naproxen, aleve, motrin. Drink plenty of water. Will repeat labs at your next office visit. Please make a lab appointment for 09-29-2018. Thyroid level is normal. Triglycerides are high. I am sending in a cholesterol medication for you to take. INSTRUCTIONS: Work on a low fat, heart healthy diet and participate in regular aerobic exercise program by working out at least 150 minutes per week; 5 days a week-30 minutes per day. Avoid red meat, fried foods. junk foods, sodas, sugary drinks, unhealthy snacking, alcohol and smoking.  Drink at least 48oz of water per day and monitor your carbohydrate intake daily.

## 2018-09-08 NOTE — Telephone Encounter (Signed)
CMA attempt to reach patient to inform on results.  No answer and left a call back number.

## 2018-09-11 ENCOUNTER — Other Ambulatory Visit: Payer: Self-pay | Admitting: Nurse Practitioner

## 2018-09-11 DIAGNOSIS — D72829 Elevated white blood cell count, unspecified: Secondary | ICD-10-CM

## 2018-09-11 DIAGNOSIS — R7989 Other specified abnormal findings of blood chemistry: Secondary | ICD-10-CM

## 2018-09-29 ENCOUNTER — Ambulatory Visit: Payer: Self-pay | Admitting: Pharmacist

## 2018-09-29 ENCOUNTER — Other Ambulatory Visit: Payer: Self-pay

## 2018-10-02 ENCOUNTER — Other Ambulatory Visit: Payer: Self-pay

## 2018-10-02 ENCOUNTER — Ambulatory Visit: Payer: Self-pay | Attending: Family Medicine

## 2018-10-02 ENCOUNTER — Encounter: Payer: Self-pay | Admitting: Internal Medicine

## 2018-10-02 DIAGNOSIS — R7989 Other specified abnormal findings of blood chemistry: Secondary | ICD-10-CM

## 2018-10-02 DIAGNOSIS — D72829 Elevated white blood cell count, unspecified: Secondary | ICD-10-CM

## 2018-10-03 LAB — CBC
Hematocrit: 45 % (ref 37.5–51.0)
Hemoglobin: 15.1 g/dL (ref 13.0–17.7)
MCH: 30.4 pg (ref 26.6–33.0)
MCHC: 33.6 g/dL (ref 31.5–35.7)
MCV: 91 fL (ref 79–97)
Platelets: 228 10*3/uL (ref 150–450)
RBC: 4.97 x10E6/uL (ref 4.14–5.80)
RDW: 14.4 % (ref 11.6–15.4)
WBC: 11.9 10*3/uL — ABNORMAL HIGH (ref 3.4–10.8)

## 2018-10-03 LAB — BASIC METABOLIC PANEL
BUN/Creatinine Ratio: 8 — ABNORMAL LOW (ref 9–20)
BUN: 10 mg/dL (ref 6–24)
CO2: 25 mmol/L (ref 20–29)
Calcium: 9.4 mg/dL (ref 8.7–10.2)
Chloride: 105 mmol/L (ref 96–106)
Creatinine, Ser: 1.22 mg/dL (ref 0.76–1.27)
GFR calc Af Amer: 78 mL/min/{1.73_m2} (ref >59)
GFR calc non Af Amer: 68 mL/min/{1.73_m2} (ref >59)
Glucose: 99 mg/dL (ref 65–99)
Potassium: 4.1 mmol/L (ref 3.5–5.2)
Sodium: 145 mmol/L — ABNORMAL HIGH (ref 134–144)

## 2018-10-05 ENCOUNTER — Telehealth: Payer: Self-pay

## 2018-10-05 NOTE — Telephone Encounter (Signed)
CMA spoke to patient to inform on results and PCP advising.   Patient verified DOB. Pt. Understood.

## 2018-10-05 NOTE — Telephone Encounter (Signed)
-----   Message from Gildardo Pounds, NP sent at 10/04/2018  8:57 AM EDT ----- WBC is slightly elevated. Likely related to tobacco/cigarette use. I would strongly encourage you to stop smoking as it also increases your risk of stroke, heart attack, lung cancer and death.

## 2018-10-09 ENCOUNTER — Ambulatory Visit: Payer: Self-pay | Admitting: Pharmacist

## 2019-10-24 ENCOUNTER — Encounter (HOSPITAL_COMMUNITY): Payer: Self-pay | Admitting: Emergency Medicine

## 2019-10-24 ENCOUNTER — Emergency Department (HOSPITAL_COMMUNITY): Payer: Self-pay

## 2019-10-24 ENCOUNTER — Emergency Department (HOSPITAL_COMMUNITY)
Admission: EM | Admit: 2019-10-24 | Discharge: 2019-10-24 | Disposition: A | Discharge status: Home / Self Care | Payer: Self-pay | Attending: Emergency Medicine | Admitting: Emergency Medicine

## 2019-10-24 ENCOUNTER — Other Ambulatory Visit: Payer: Self-pay

## 2019-10-24 DIAGNOSIS — Y93E6 Activity, residential relocation: Secondary | ICD-10-CM | POA: Insufficient documentation

## 2019-10-24 DIAGNOSIS — Z79899 Other long term (current) drug therapy: Secondary | ICD-10-CM | POA: Insufficient documentation

## 2019-10-24 DIAGNOSIS — Z7984 Long term (current) use of oral hypoglycemic drugs: Secondary | ICD-10-CM | POA: Insufficient documentation

## 2019-10-24 DIAGNOSIS — Y999 Unspecified external cause status: Secondary | ICD-10-CM | POA: Insufficient documentation

## 2019-10-24 DIAGNOSIS — R7303 Prediabetes: Secondary | ICD-10-CM | POA: Insufficient documentation

## 2019-10-24 DIAGNOSIS — M25561 Pain in right knee: Secondary | ICD-10-CM | POA: Insufficient documentation

## 2019-10-24 DIAGNOSIS — M25562 Pain in left knee: Secondary | ICD-10-CM

## 2019-10-24 DIAGNOSIS — X500XXA Overexertion from strenuous movement or load, initial encounter: Secondary | ICD-10-CM | POA: Insufficient documentation

## 2019-10-24 DIAGNOSIS — Y929 Unspecified place or not applicable: Secondary | ICD-10-CM | POA: Insufficient documentation

## 2019-10-24 DIAGNOSIS — F17210 Nicotine dependence, cigarettes, uncomplicated: Secondary | ICD-10-CM | POA: Insufficient documentation

## 2019-10-24 DIAGNOSIS — R030 Elevated blood-pressure reading, without diagnosis of hypertension: Secondary | ICD-10-CM | POA: Insufficient documentation

## 2019-10-24 NOTE — ED Triage Notes (Signed)
Pt states he twisted his knee a few days ago, reports L knee pain since then that feels like it is behind his patella. Denies any swelling to his knee. Endorses Difficulty bearing weight.

## 2019-10-24 NOTE — Progress Notes (Signed)
Orthopedic Tech Progress Note Patient Details:  Jorge [name] 19-Dec-1965 [id number]  Ortho Devices Type of Ortho Device: Crutches, Knee Immobilizer Ortho Device/Splint Location: LLE Ortho Device/Splint Interventions: Application   Post Interventions Patient Tolerated: Well Instructions Provided: Care of device, Poper ambulation with device, Adjustment of device   Jorge [name] E Jorge [name] 10/24/2019, 10:05 AM

## 2019-10-24 NOTE — Discharge Instructions (Addendum)
You have been diagnosed today with Left Knee Pain.  At this time there does not appear to be the presence of an emergent medical condition, however there is always the potential for conditions to change. Please read and follow the below instructions.  Please return to the Emergency Department immediately for any new or worsening symptoms. Please be sure to follow up with your Primary Care Provider within one week regarding your visit today; please call their office to schedule an appointment even if you are feeling better for a follow-up visit. Please call the orthopedic specialist Dr. Griffin Basil on your discharge paperwork today to schedule follow-up appointment for further evaluation of your left knee pain.  Please use the knee immobilizer whenever you are moving around to help protect your knee from further injury and use the crutches when ambulating.  Use rest, ice and elevation to help with pain.  As we discussed unseen injuries such as ligamentous, tendinous, meniscal or unseen bony injury may be present and follow-up with orthopedist is strongly advised. Please take Ibuprofen (Advil, motrin) and Tylenol (acetaminophen) to relieve your pain.  You may take up to 400 MG (2 pills) of normal strength ibuprofen every 8 hours as needed.  In between doses of ibuprofen you make take tylenol, up to 500 mg (one extra strength pill).  Do not take more than 3,000 mg tylenol in a 24 hour period.  Please check all medication labels as many medications such as pain and cold medications may contain tylenol.  Do not drink alcohol while taking these medications.  Do not take other NSAID'S while taking ibuprofen (such as aleve or naproxen).  Please take ibuprofen with food to decrease stomach upset. Additionally your blood pressure was elevated in the ER today.  Please have your primary care doctor recheck your blood pressure within 1 week and discuss medication management if needed at that time.  Get help right away and  return to the Emergency Department if: Your knee swells, and the swelling gets worse. You cannot move your knee. You have very bad knee pain. You have fever or chills Get a very bad headache. Start to feel mixed up (confused). Feel weak or numb. Feel faint. Have very bad pain in your: Chest. Belly (abdomen). Throw up more than once. Have trouble breathing. You have any new/concerning or worsening of symptoms  Please read the additional information packets attached to your discharge summary.  Do not take your medicine if  develop an itchy rash, swelling in your mouth or lips, or difficulty breathing; call 911 and seek immediate emergency medical attention if this occurs.  Note: Portions of this text may have been transcribed using voice recognition software. Every effort was made to ensure accuracy; however, inadvertent computerized transcription errors may still be present.

## 2019-10-24 NOTE — ED Provider Notes (Addendum)
[facility] EMERGENCY DEPARTMENT Provider Note   CSN: [id number] Arrival date & time: 10/24/19  9622     History Chief Complaint  Patient presents with  . Knee Pain    Jorge [name] is a 54 y.o. male history hypertension, prediabetes, allergies.  Patient presents today with left knee pain that began 3 days ago.  He was helping his daughter move, he was pulling a bucket out of the back of the car and his left foot was halfway on the curb.  Patient reports that the bucket dislodged quickly causing him to stumble backwards and his left foot slipped off of the curb onto the ground which caused him to "twist" his left knee.  Since that time patient has developed left knee pain, only with ambulation described as a squeezing aching sensation moderate in intensity improved with rest.  He has attempted Epson salt without relief and took 1 Tylenol last night with minimal relief.  Patient denies any additional concerns today, no head injury, chest pain, back pain, numbness/weakness, swelling/color change, wound or injury of the other 3 extremities.  HPI     Past Medical History:  Diagnosis Date  . Allergy   . Environmental allergies   . Sinus infection     Patient Active Problem List   Diagnosis Date Noted  . Essential hypertension 09/06/2018  . Prediabetes 09/06/2018  . Non-seasonal allergic rhinitis due to pollen 09/06/2018    Past Surgical History:  Procedure Laterality Date  . NO PAST SURGERIES         Family History  Problem Relation Age of Onset  . Ovarian cancer Mother   . Diabetes Father   . Colon cancer Neg Hx     Social History   Tobacco Use  . Smoking status: Current Every Day Smoker    Packs/day: 0.25    Types: Cigarettes  . Smokeless tobacco: Never Used  Substance Use Topics  . Alcohol use: Yes    Comment: beer 1-2 times per month per pt.  . Drug use: Yes    Types: Marijuana    Comment: daily per pt last 12/05/16    Home  Medications Prior to Admission medications   Medication Sig Start Date End Date Taking? Authorizing Provider  atorvastatin (LIPITOR) 40 MG tablet Take 1 tablet (40 mg total) by mouth daily. 09/07/18   Gildardo Pounds, NP  azelastine (ASTELIN) 0.1 % nasal spray Place 2 sprays into both nostrils 2 (two) times daily for 30 days. Use in each nostril as directed 09/06/18 10/06/18  Gildardo Pounds, NP  Blood Glucose Monitoring Suppl (TRUE METRIX GO GLUCOSE METER) w/Device KIT 1 each by Does not apply route every 8 (eight) hours as needed. Patient not taking: Reported on 12/07/2016 10/04/16   Maren Reamer, MD  cetirizine (ZYRTEC) 10 MG tablet Take 1 tablet (10 mg total) by mouth daily. 09/06/18   Gildardo Pounds, NP  glucose blood test strip Use as instructed 09/06/18   Gildardo Pounds, NP  lisinopril-hydrochlorothiazide (PRINZIDE,ZESTORETIC) 10-12.5 MG tablet Take 1 tablet by mouth daily. 09/06/18   Gildardo Pounds, NP  metFORMIN (GLUCOPHAGE) 500 MG tablet Take 1 tablet (500 mg total) by mouth 2 (two) times daily with a meal. 09/06/18   Gildardo Pounds, NP  TRUEplus Lancets 26G MISC 1 each by Does not apply route every 8 (eight) hours as needed. 09/06/18   Gildardo Pounds, NP    Allergies    Peanut-containing drug products  Review of Systems   Review of Systems  Constitutional: Negative.  Negative for chills and fever.  Cardiovascular: Negative.  Negative for chest pain.  Gastrointestinal: Negative.  Negative for abdominal pain.  Musculoskeletal: Positive for arthralgias (Left knee). Negative for back pain, joint swelling and neck pain.  Skin: Negative.  Negative for color change.  Neurological: Negative.  Negative for weakness, numbness and headaches.    Physical Exam Updated Vital Signs BP (!) 172/100   Pulse 88   Temp 98.3 F (36.8 C)   Resp 15   SpO2 98%   Physical Exam Constitutional:      General: He is not in acute distress.    Appearance: Normal appearance. He is  well-developed. He is obese. He is not ill-appearing or diaphoretic.  HENT:     Head: Normocephalic and atraumatic.     Right Ear: External ear normal.     Left Ear: External ear normal.     Nose: Nose normal.  Eyes:     General: Vision grossly intact. Gaze aligned appropriately.     Pupils: Pupils are equal, round, and reactive to light.  Neck:     Trachea: Trachea and phonation normal. No tracheal deviation.  Cardiovascular:     Rate and Rhythm: Normal rate and regular rhythm.     Pulses: Normal pulses.          Dorsalis pedis pulses are 2+ on the right side and 2+ on the left side.       Posterior tibial pulses are 2+ on the right side and 2+ on the left side.  Pulmonary:     Effort: Pulmonary effort is normal. No respiratory distress.  Abdominal:     General: There is no distension.     Palpations: Abdomen is soft.     Tenderness: There is no abdominal tenderness. There is no guarding or rebound.  Musculoskeletal:     Cervical back: Normal range of motion.     Right hip: Normal range of motion. Normal strength.     Left hip: Normal range of motion. Normal strength.     Right upper leg: No swelling, deformity or tenderness.     Left upper leg: No swelling, deformity or tenderness.     Right knee: Normal.     Left knee: No swelling, erythema, lacerations or bony tenderness. Decreased range of motion. Tenderness (Diffusely around patella) present. No medial joint line, lateral joint line, MCL or LCL tenderness. No LCL laxity or MCL laxity.Normal alignment and normal patellar mobility.     Right lower leg: Normal. No swelling or tenderness. No edema.     Left lower leg: Normal. No swelling or tenderness. No edema.     Right ankle: Normal.     Left ankle: Normal.     Right foot: Normal.     Left foot: Normal.     Comments: Normal range of motion of the upper extremities without evidence of pain.  Patient pulls to sit up straight without pain or difficulty.  Feet:     Right foot:       Protective Sensation: 5 sites tested. 5 sites sensed.     Left foot:     Protective Sensation: 5 sites tested. 5 sites sensed.  Skin:    General: Skin is warm and dry.  Neurological:     Mental Status: He is alert.     GCS: GCS eye subscore is 4. GCS verbal subscore is 5. GCS motor subscore is 6.  Comments: Speech is clear and goal oriented, follows commands Major Cranial nerves without deficit, no facial droop Normal strength in upper and lower extremities bilaterally including dorsiflexion and plantar flexion Sensation normal to light and sharp touch Moves extremities without ataxia, coordination intact  Psychiatric:        Behavior: Behavior normal.     ED Results / Procedures / Treatments   Labs (all labs ordered are listed, but only abnormal results are displayed) Labs Reviewed - No data to display  EKG None  Radiology DG Knee Complete 4 Views Left  Result Date: 10/24/2019 CLINICAL DATA:  Pain since a twisting injury several days ago. EXAM: LEFT KNEE - COMPLETE 4+ VIEW COMPARISON:  None. FINDINGS: No fracture or dislocation. Probable small effusion. Slight medial joint space narrowing. Otherwise normal. IMPRESSION: Slight medial joint space narrowing. Probable small effusion. Electronically Signed   By: Lorriane Shire M.D.   On: 10/24/2019 07:49    Procedures Procedures (including critical care time)  Medications Ordered in ED Medications - No data to display  ED Course  I have reviewed the triage vital signs and the nursing notes.  Pertinent labs & imaging results that were available during my care of the patient were reviewed by me and considered in my medical decision making (see chart for details).  Clinical Course as of Oct 23 952  Wed Oct 24, 2019  0918 Not in room   [BM]    Clinical Course User Index [BM] Gari Crown   MDM Rules/Calculators/A&P                     54 year old male presents today for left knee pain that began 3  days ago after he stepped incorrectly off of a curb twisting his left knee.  He has diffuse pain around the left patella without swelling or overlying skin change.  Patient has no other complaints and denies any other injuries today.  He denies any neurologic complaint and he is neurovascularly intact to all 4 extremities.  Range of motion is slightly decreased to the left knee secondary to pain today.  He has no medial or lateral joint line tenderness, there is no locking or gross ligamentous laxity, no posterior knee pain.  Patella tendon is intact and he has good range of motion and strength at the left ankle and left hip.  No evidence of DVT, septic arthritis, compartment syndrome, cellulitis, gross ligamentous laxity or neurovascular compromise today.  X-ray was obtained in triage, I have personally reviewed patient's x-ray, radiologist read as below.  DG Left Knee:  IMPRESSION:  Slight medial joint space narrowing. Probable small effusion.  I have reviewed and interpreted patient's x-ray, per my interpretation no obvious fracture or dislocation.  I agree with radiologist interpretation as above. - Suspect patient may have possible ligamentous injury, he will need further evaluation by orthopedist.  Patient was placed in knee immobilizer and given crutches today, we discussed rice therapy and OTC anti-inflammatories to help with symptoms.  He will call Dr. Griffin Basil today to schedule a follow-up appointment.  Work note was given.  Additionally patient noted to have elevated blood pressure in triage today, he is asymptomatic regarding his elevated blood pressure reading.  Patient advised to follow-up with his PCP within 1 week for blood pressure recheck and discuss medication management if needed at that time.  Patient informed of signs/symptoms of hypertensive emergency/urgency and to return to the ER if they occur.  At this time  there does not appear to be any evidence of an acute emergency medical  condition and the patient appears stable for discharge with appropriate outpatient follow up. Diagnosis was discussed with patient who verbalizes understanding of care plan and is agreeable to discharge. I have discussed return precautions with patient who verbalizes understanding. Patient encouraged to follow-up with their PCP and ortho. All questions answered.   Note: Portions of this report may have been transcribed using voice recognition software. Every effort was made to ensure accuracy; however, inadvertent computerized transcription errors may still be present. Final Clinical Impression(s) / ED Diagnoses Final diagnoses:  Acute pain of left knee  Elevated blood pressure reading    Rx / DC Orders ED Discharge Orders    None       Gari Crown 10/24/19 0954    Deliah Boston, PA-C 10/24/19 4158    Quintella Reichert, MD 10/25/19 [phone]

## 2020-07-06 ENCOUNTER — Encounter (HOSPITAL_COMMUNITY): Payer: Self-pay | Admitting: Emergency Medicine

## 2020-07-06 ENCOUNTER — Emergency Department (HOSPITAL_COMMUNITY)
Admission: EM | Admit: 2020-07-06 | Discharge: 2020-07-06 | Disposition: A | Discharge status: Home / Self Care | Payer: Self-pay | Attending: Emergency Medicine | Admitting: Emergency Medicine

## 2020-07-06 ENCOUNTER — Encounter (HOSPITAL_COMMUNITY): Payer: Self-pay

## 2020-07-06 ENCOUNTER — Other Ambulatory Visit: Payer: Self-pay

## 2020-07-06 ENCOUNTER — Emergency Department (HOSPITAL_COMMUNITY)
Admission: EM | Admit: 2020-07-06 | Discharge: 2020-07-07 | Disposition: A | Discharge status: Home / Self Care | Payer: Self-pay | Attending: Emergency Medicine | Admitting: Emergency Medicine

## 2020-07-06 DIAGNOSIS — Z7984 Long term (current) use of oral hypoglycemic drugs: Secondary | ICD-10-CM | POA: Insufficient documentation

## 2020-07-06 DIAGNOSIS — E119 Type 2 diabetes mellitus without complications: Secondary | ICD-10-CM | POA: Insufficient documentation

## 2020-07-06 DIAGNOSIS — Z79899 Other long term (current) drug therapy: Secondary | ICD-10-CM | POA: Insufficient documentation

## 2020-07-06 DIAGNOSIS — I10 Essential (primary) hypertension: Secondary | ICD-10-CM | POA: Insufficient documentation

## 2020-07-06 DIAGNOSIS — K859 Acute pancreatitis without necrosis or infection, unspecified: Secondary | ICD-10-CM | POA: Insufficient documentation

## 2020-07-06 DIAGNOSIS — Z5321 Procedure and treatment not carried out due to patient leaving prior to being seen by health care provider: Secondary | ICD-10-CM | POA: Insufficient documentation

## 2020-07-06 DIAGNOSIS — R109 Unspecified abdominal pain: Secondary | ICD-10-CM | POA: Insufficient documentation

## 2020-07-06 DIAGNOSIS — F17210 Nicotine dependence, cigarettes, uncomplicated: Secondary | ICD-10-CM | POA: Insufficient documentation

## 2020-07-06 DIAGNOSIS — Z91010 Allergy to peanuts: Secondary | ICD-10-CM | POA: Insufficient documentation

## 2020-07-06 HISTORY — DX: Type 2 diabetes mellitus without complications: E11.9

## 2020-07-06 HISTORY — DX: Essential (primary) hypertension: I10

## 2020-07-06 LAB — LIPASE, BLOOD: Lipase: 160 U/L — ABNORMAL HIGH (ref 11–51)

## 2020-07-06 LAB — CBC
HCT: 49 % (ref 39.0–52.0)
Hemoglobin: 15.7 g/dL (ref 13.0–17.0)
MCH: 29.8 pg (ref 26.0–34.0)
MCHC: 32 g/dL (ref 30.0–36.0)
MCV: 93 fL (ref 80.0–100.0)
Platelets: 239 10*3/uL (ref 150–400)
RBC: 5.27 MIL/uL (ref 4.22–5.81)
RDW: 14.3 % (ref 11.5–15.5)
WBC: 15 10*3/uL — ABNORMAL HIGH (ref 4.0–10.5)
nRBC: 0 % (ref 0.0–0.2)

## 2020-07-06 LAB — COMPREHENSIVE METABOLIC PANEL
ALT: 28 U/L (ref 0–44)
AST: 22 U/L (ref 15–41)
Albumin: 3.7 g/dL (ref 3.5–5.0)
Alkaline Phosphatase: 65 U/L (ref 38–126)
Anion gap: 11 (ref 5–15)
BUN: 8 mg/dL (ref 6–20)
CO2: 26 mmol/L (ref 22–32)
Calcium: 8.8 mg/dL — ABNORMAL LOW (ref 8.9–10.3)
Chloride: 97 mmol/L — ABNORMAL LOW (ref 98–111)
Creatinine, Ser: 1.06 mg/dL (ref 0.61–1.24)
GFR, Estimated: >60 mL/min (ref >60)
Glucose, Bld: 171 mg/dL — ABNORMAL HIGH (ref 70–99)
Potassium: 3.7 mmol/L (ref 3.5–5.1)
Sodium: 134 mmol/L — ABNORMAL LOW (ref 135–145)
Total Bilirubin: 0.3 mg/dL (ref 0.3–1.2)
Total Protein: 7.2 g/dL (ref 6.5–8.1)

## 2020-07-06 LAB — URINALYSIS, ROUTINE W REFLEX MICROSCOPIC
Bilirubin Urine: NEGATIVE
Glucose, UA: NEGATIVE mg/dL
Ketones, ur: NEGATIVE mg/dL
Leukocytes,Ua: NEGATIVE
Nitrite: NEGATIVE
Protein, ur: NEGATIVE mg/dL
Specific Gravity, Urine: 1.013 (ref 1.005–1.030)
pH: 5 (ref 5.0–8.0)

## 2020-07-06 MED ORDER — SODIUM CHLORIDE 0.9 % IV BOLUS
1000.0000 mL | Freq: Once | INTRAVENOUS | Status: AC
  Administered 2020-07-07: 1000 mL via INTRAVENOUS

## 2020-07-06 NOTE — ED Triage Notes (Signed)
Pt reports no bowel movement in 1 week. Having pain along left abdomen. Took magnesium citrate with no relief. Pt awake, alert, appropriate. NAD

## 2020-07-06 NOTE — ED Notes (Signed)
Pt left due to not being seen quick enough. Pt was seen getting into car and leaving

## 2020-07-06 NOTE — ED Triage Notes (Signed)
Pt reports left sided abdominal pain and no 2-3 days.

## 2020-07-06 NOTE — ED Provider Notes (Signed)
[facility] DEPT Provider Note   CSN: [id number] Arrival date & time: 07/06/20  2153     History Chief Complaint  Patient presents with  . Abdominal Pain    Jorge [name] is a 55 y.o. male.  Patient is a 55 year old male with history of diabetes, hypertension.  He presents today for evaluation of abdominal pain.  Patient describes left upper and left lower quadrant pain that has been worsening over the past week.  The pain is constant, but worse when he eats.  He describes decreased bowel movements over the past week, but no blood.  He denies any fevers or chills.  He denies any vomiting.  He has had no prior abdominal surgeries, but did have a colonoscopy at the age of 62 which was unremarkable.  The history is provided by the patient.  Abdominal Pain Pain location:  LUQ and LLQ Pain quality: cramping   Pain radiates to:  Does not radiate Pain severity:  Moderate Duration:  1 week Timing:  Constant Progression:  Worsening Chronicity:  New Relieved by:  Nothing Worsened by:  Nothing Associated symptoms: no fever        Past Medical History:  Diagnosis Date  . Allergy   . Diabetes mellitus without complication ([place])   . Environmental allergies   . Hypertension   . Sinus infection     Patient Active Problem List   Diagnosis Date Noted  . Essential hypertension 09/06/2018  . Prediabetes 09/06/2018  . Non-seasonal allergic rhinitis due to pollen 09/06/2018    Past Surgical History:  Procedure Laterality Date  . NO PAST SURGERIES         Family History  Problem Relation Age of Onset  . Ovarian cancer Mother   . Diabetes Father   . Colon cancer Neg Hx     Social History   Tobacco Use  . Smoking status: Current Every Day Smoker    Packs/day: 0.25    Types: Cigarettes  . Smokeless tobacco: Never Used  Vaping Use  . Vaping Use: Never used  Substance Use Topics  . Alcohol use: Yes    Comment: beer 1-2 times per month  per pt.  . Drug use: Yes    Types: Marijuana    Comment: daily per pt last 12/05/16    Home Medications Prior to Admission medications   Medication Sig Start Date End Date Taking? Authorizing Provider  atorvastatin (LIPITOR) 40 MG tablet Take 1 tablet (40 mg total) by mouth daily. 09/07/18   Gildardo Pounds, NP  azelastine (ASTELIN) 0.1 % nasal spray Place 2 sprays into both nostrils 2 (two) times daily for 30 days. Use in each nostril as directed 09/06/18 10/06/18  Gildardo Pounds, NP  Blood Glucose Monitoring Suppl (TRUE METRIX GO GLUCOSE METER) w/Device KIT 1 each by Does not apply route every 8 (eight) hours as needed. Patient not taking: Reported on 12/07/2016 10/04/16   Maren Reamer, MD  cetirizine (ZYRTEC) 10 MG tablet Take 1 tablet (10 mg total) by mouth daily. 09/06/18   Gildardo Pounds, NP  glucose blood test strip Use as instructed 09/06/18   Gildardo Pounds, NP  lisinopril-hydrochlorothiazide (PRINZIDE,ZESTORETIC) 10-12.5 MG tablet Take 1 tablet by mouth daily. 09/06/18   Gildardo Pounds, NP  metFORMIN (GLUCOPHAGE) 500 MG tablet Take 1 tablet (500 mg total) by mouth 2 (two) times daily with a meal. 09/06/18   Gildardo Pounds, NP  TRUEplus Lancets 26G MISC 1 each by  Does not apply route every 8 (eight) hours as needed. 09/06/18   Fleming, [name], NP    Allergies    Other and Peanut-containing drug products  Review of Systems   Review of Systems  Constitutional: Negative for fever.  Gastrointestinal: Positive for abdominal pain.  All other systems reviewed and are negative.   Physical Exam Updated Vital Signs BP (!) 155/112   Pulse (!) 109   Temp 98.1 F (36.7 C) (Oral)   Resp 20   Ht 5' 4" (1.626 m)   Wt 99.8 kg   SpO2 100%   BMI 37.76 kg/m   Physical Exam Vitals and nursing note reviewed.  Constitutional:      General: He is not in acute distress.    Appearance: He is well-developed and well-nourished. He is not diaphoretic.  HENT:     Head: Normocephalic  and atraumatic.     Mouth/Throat:     Mouth: Oropharynx is clear and moist.  Cardiovascular:     Rate and Rhythm: Normal rate and regular rhythm.     Heart sounds: No murmur heard. No friction rub.  Pulmonary:     Effort: Pulmonary effort is normal. No respiratory distress.     Breath sounds: Normal breath sounds. No wheezing or rales.  Abdominal:     General: Bowel sounds are normal. There is no distension.     Palpations: Abdomen is soft.     Tenderness: There is abdominal tenderness in the left upper quadrant and left lower quadrant. There is no right CVA tenderness, left CVA tenderness, guarding or rebound.  Musculoskeletal:        General: No edema. Normal range of motion.     Cervical back: Normal range of motion and neck supple.  Skin:    General: Skin is warm and dry.  Neurological:     Mental Status: He is alert and oriented to person, place, and time.     Coordination: Coordination normal.     ED Results / Procedures / Treatments   Labs (all labs ordered are listed, but only abnormal results are displayed) Labs Reviewed  COMPREHENSIVE METABOLIC PANEL  LIPASE, BLOOD  CBC WITH DIFFERENTIAL/PLATELET  URINALYSIS, ROUTINE W REFLEX MICROSCOPIC    EKG ED ECG REPORT   Date: 07/07/2020  Rate: 88  Rhythm: normal sinus rhythm  QRS Axis: right  Intervals: normal  ST/T Wave abnormalities: normal  Conduction Disutrbances:none  Narrative Interpretation:   Old EKG Reviewed: none available  I have personally reviewed the EKG tracing and agree with the computerized printout as noted.   Radiology No results found.  Procedures Procedures (including critical care time)  Medications Ordered in ED Medications  sodium chloride 0.9 % bolus 1,000 mL (has no administration in time range)    ED Course  I have reviewed the triage vital signs and the nursing notes.  Pertinent labs & imaging results that were available during my care of the patient were reviewed by me and  considered in my medical decision making (see chart for details).    MDM Rules/Calculators/A&P  Patient presenting here with complaints of abdominal pain that has been worsening over the past few days.  His pain is left upper quadrant primarily.  Laboratory studies reveal a slight white count of 15,000 with lipase of 126.  CT scan shows peripancreatic fat stranding consistent with acute pancreatitis, but no evidence for pancreatic necrosis.  Patient given IV fluids and pain medicine and tells me he is feeling much better.    Patient reports a history of alcohol use in the past, but tells me he has not had consumed alcohol since Thanksgiving.  There is no sign of gallstones or biliary obstruction on his CT scan.  He does have a history of hyperlipidemia.  I am uncertain as to what the exact cause of the pancreatitis is, but patient will be treated with pancreatic rest with clear liquids and pain medicine.  He is now resting comfortably and discharge seems appropriate.  He is to return as needed if his symptoms worsen or change.  Final Clinical Impression(s) / ED Diagnoses Final diagnoses:  None    Rx / DC Orders ED Discharge Orders    None       Delo, Douglas, MD 07/07/20 0326  

## 2020-07-07 ENCOUNTER — Encounter (HOSPITAL_COMMUNITY): Payer: Self-pay

## 2020-07-07 ENCOUNTER — Emergency Department (HOSPITAL_COMMUNITY): Payer: Self-pay

## 2020-07-07 LAB — CBC WITH DIFFERENTIAL/PLATELET
Abs Immature Granulocytes: 0.04 10*3/uL (ref 0.00–0.07)
Basophils Absolute: 0.1 10*3/uL (ref 0.0–0.1)
Basophils Relative: 0 %
Eosinophils Absolute: 0.1 10*3/uL (ref 0.0–0.5)
Eosinophils Relative: 1 %
HCT: 49.4 % (ref 39.0–52.0)
Hemoglobin: 16.1 g/dL (ref 13.0–17.0)
Immature Granulocytes: 0 %
Lymphocytes Relative: 19 %
Lymphs Abs: 2.8 10*3/uL (ref 0.7–4.0)
MCH: 29.9 pg (ref 26.0–34.0)
MCHC: 32.6 g/dL (ref 30.0–36.0)
MCV: 91.7 fL (ref 80.0–100.0)
Monocytes Absolute: 0.9 10*3/uL (ref 0.1–1.0)
Monocytes Relative: 6 %
Neutro Abs: 11 10*3/uL — ABNORMAL HIGH (ref 1.7–7.7)
Neutrophils Relative %: 74 %
Platelets: 233 10*3/uL (ref 150–400)
RBC: 5.39 MIL/uL (ref 4.22–5.81)
RDW: 14.5 % (ref 11.5–15.5)
WBC: 14.9 10*3/uL — ABNORMAL HIGH (ref 4.0–10.5)
nRBC: 0 % (ref 0.0–0.2)

## 2020-07-07 LAB — URINALYSIS, ROUTINE W REFLEX MICROSCOPIC
Bacteria, UA: NONE SEEN
Bilirubin Urine: NEGATIVE
Glucose, UA: NEGATIVE mg/dL
Ketones, ur: NEGATIVE mg/dL
Leukocytes,Ua: NEGATIVE
Nitrite: NEGATIVE
Protein, ur: NEGATIVE mg/dL
Specific Gravity, Urine: 1.01 (ref 1.005–1.030)
pH: 7 (ref 5.0–8.0)

## 2020-07-07 LAB — COMPREHENSIVE METABOLIC PANEL
ALT: 28 U/L (ref 0–44)
AST: 22 U/L (ref 15–41)
Albumin: 4 g/dL (ref 3.5–5.0)
Alkaline Phosphatase: 61 U/L (ref 38–126)
Anion gap: 8 (ref 5–15)
BUN: 9 mg/dL (ref 6–20)
CO2: 29 mmol/L (ref 22–32)
Calcium: 9 mg/dL (ref 8.9–10.3)
Chloride: 99 mmol/L (ref 98–111)
Creatinine, Ser: 1.1 mg/dL (ref 0.61–1.24)
GFR, Estimated: >60 mL/min (ref >60)
Glucose, Bld: 140 mg/dL — ABNORMAL HIGH (ref 70–99)
Potassium: 4.3 mmol/L (ref 3.5–5.1)
Sodium: 136 mmol/L (ref 135–145)
Total Bilirubin: 0.6 mg/dL (ref 0.3–1.2)
Total Protein: 7.6 g/dL (ref 6.5–8.1)

## 2020-07-07 LAB — LIPASE, BLOOD: Lipase: 126 U/L — ABNORMAL HIGH (ref 11–51)

## 2020-07-07 MED ORDER — IOHEXOL 300 MG/ML  SOLN
100.0000 mL | Freq: Once | INTRAMUSCULAR | Status: AC | PRN
  Administered 2020-07-07: 100 mL via INTRAVENOUS

## 2020-07-07 MED ORDER — ONDANSETRON HCL 4 MG/2ML IJ SOLN
4.0000 mg | Freq: Once | INTRAMUSCULAR | Status: AC
  Administered 2020-07-07: 4 mg via INTRAVENOUS
  Filled 2020-07-07: qty 2

## 2020-07-07 MED ORDER — OXYCODONE-ACETAMINOPHEN 5-325 MG PO TABS: 1.0000 | ORAL_TABLET | Freq: Four times a day (QID) | ORAL | 0 refills | Status: DC | PRN

## 2020-07-07 MED ORDER — HYDROMORPHONE HCL 1 MG/ML IJ SOLN
1.0000 mg | Freq: Once | INTRAMUSCULAR | Status: AC
  Administered 2020-07-07: 1 mg via INTRAVENOUS
  Filled 2020-07-07: qty 1

## 2020-07-07 NOTE — ED Notes (Signed)
Opened chart at pts request to answer question regarding medications

## 2020-07-07 NOTE — Discharge Instructions (Addendum)
Clear liquid diet for the next several days, then slowly advance to crackers, toast, and bland diet as tolerated.  Begin taking Percocet as prescribed as needed for pain.  Return to the emergency department if you develop worsening pain, high fevers, bloody stool or vomit, or other new and concerning symptoms.

## 2021-02-26 IMAGING — CT CT ABD-PELV W/ CM
2 of 5 series · 16 of 46 positions shown, 18 images · IV contrast (omnipaque)
Comparison: None.

CLINICAL DATA: Abdominal pain.  Left-sided abdominal pain.

EXAM:
CT ABDOMEN AND PELVIS WITH CONTRAST
TECHNIQUE: Multidetector CT imaging of the abdomen and pelvis was performed
using the standard protocol following bolus administration of
intravenous contrast.
CONTRAST:  100mL OMNIPAQUE IOHEXOL 300 MG/ML  SOLN

[Series 2: axial st · axial · 0.93mm/px · z∈[-454,-80]mm · 13 of 89 slices shown, 15 images]
[im 7/89  soft-tissue]
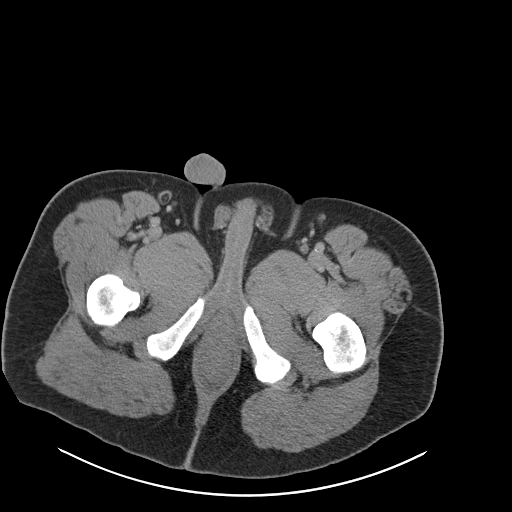
[im 7/89  bone]
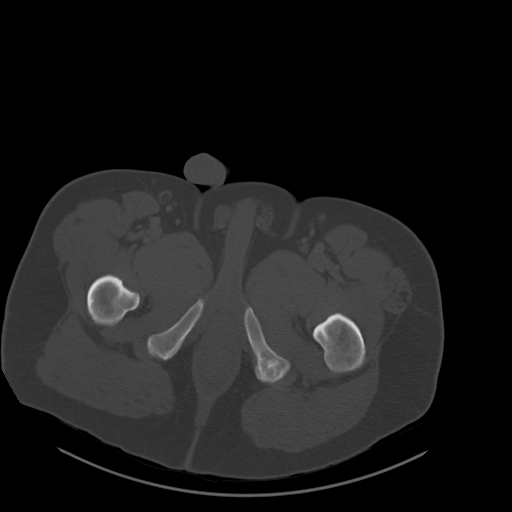
[im 13/89  soft-tissue]
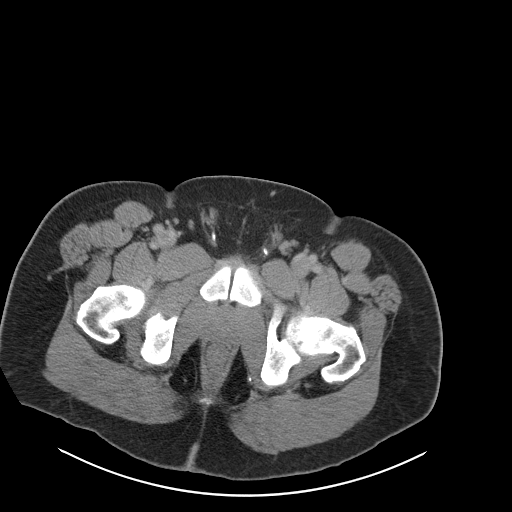
[im 19/89  soft-tissue]
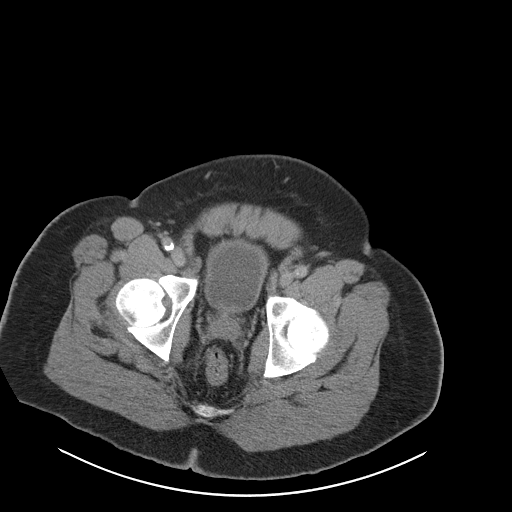
[im 26/89  soft-tissue]
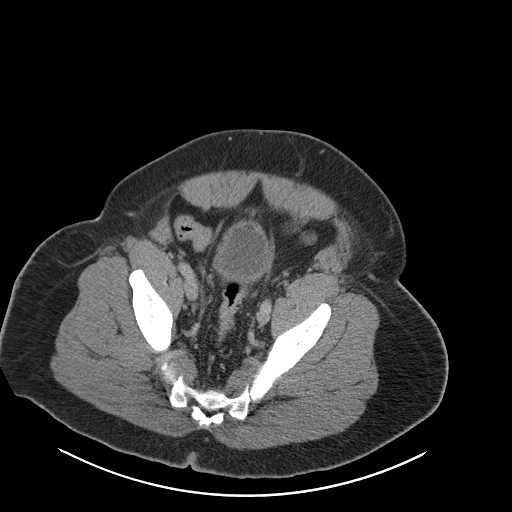
[im 32/89  soft-tissue]
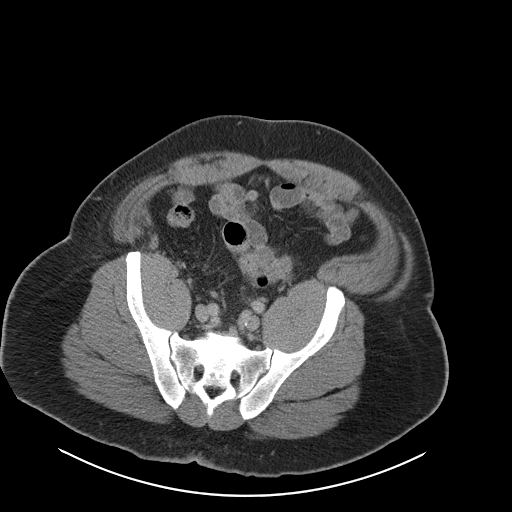
[im 38/89  soft-tissue]
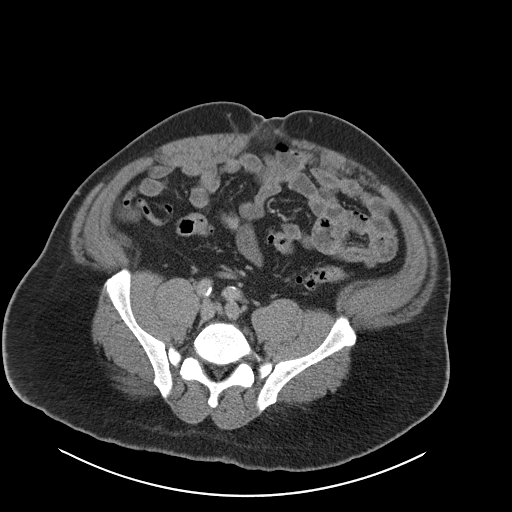
[im 45/89  soft-tissue]
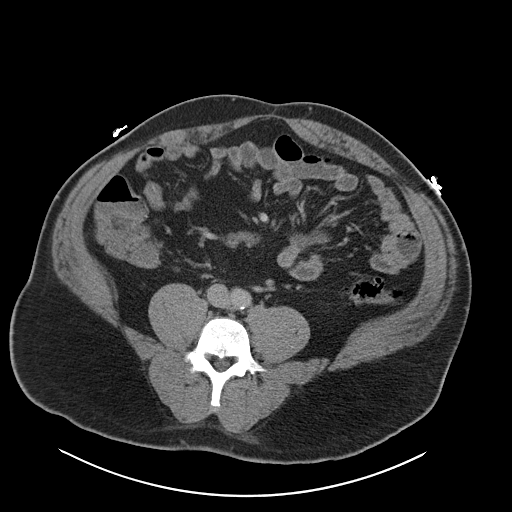
[im 51/89  soft-tissue]
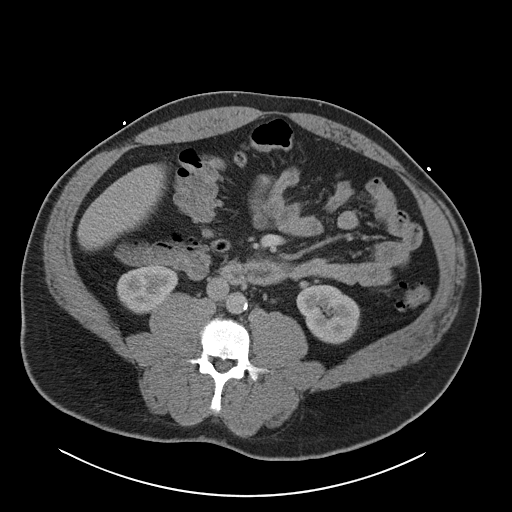
[im 57/89  soft-tissue]
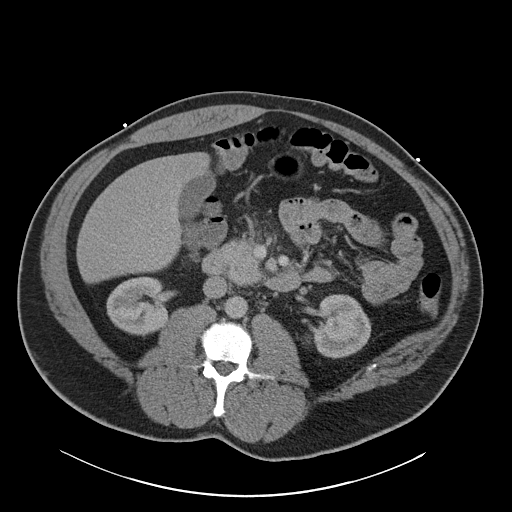
[im 57/89  bone]
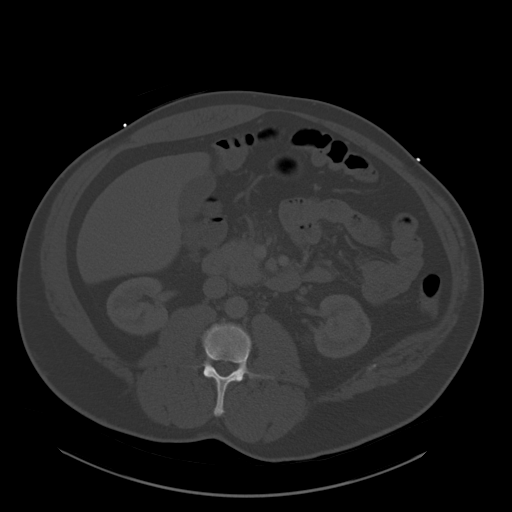
[im 63/89  soft-tissue]
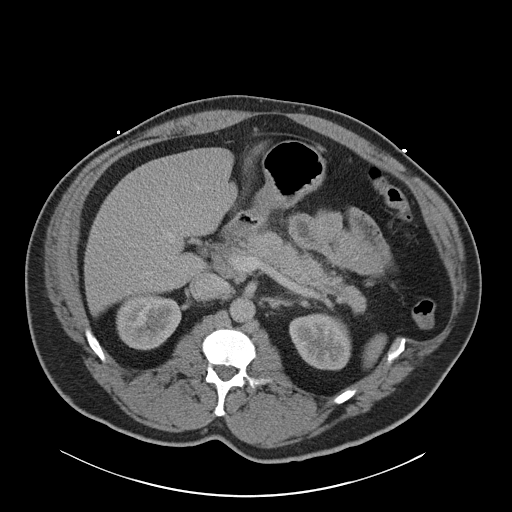
[im 70/89  soft-tissue]
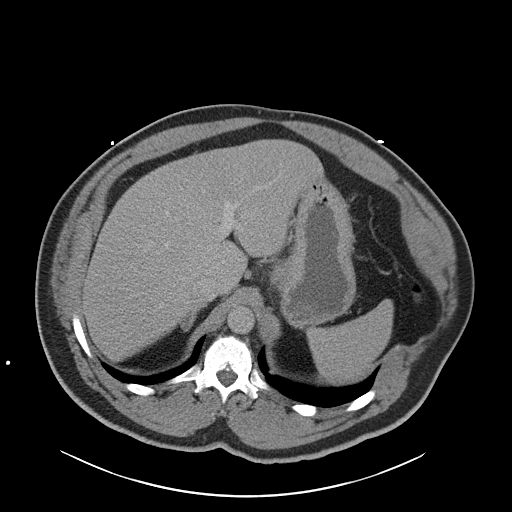
[im 76/89  soft-tissue]
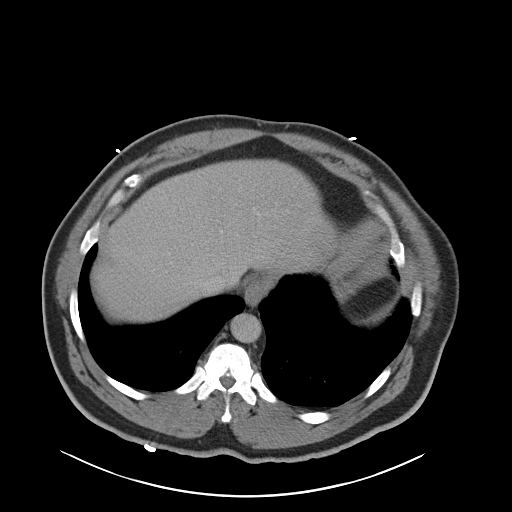
[im 82/89  soft-tissue]
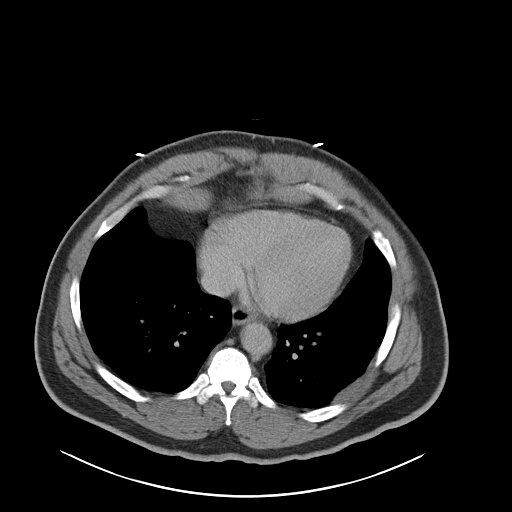

[Series 5: coronal st · coronal · 0.90mm/px · 3 of 189 slices shown]
[im 63/189  soft-tissue]
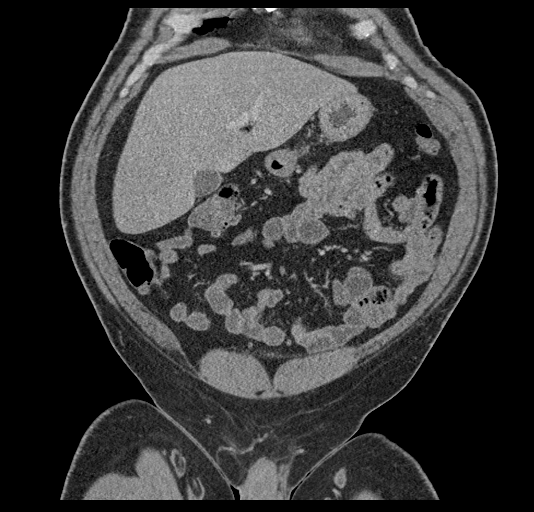
[im 84/189  soft-tissue]
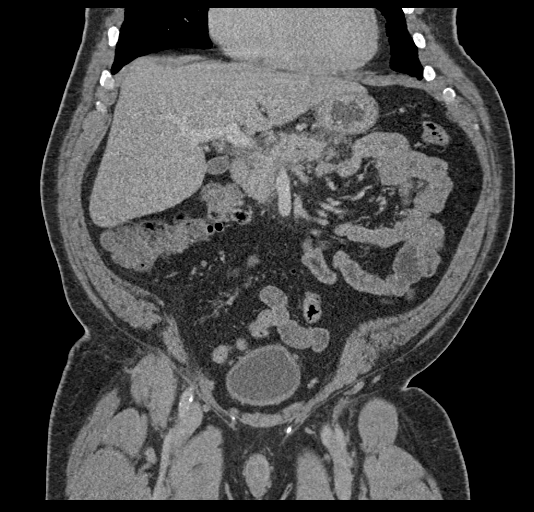
[im 105/189  soft-tissue]
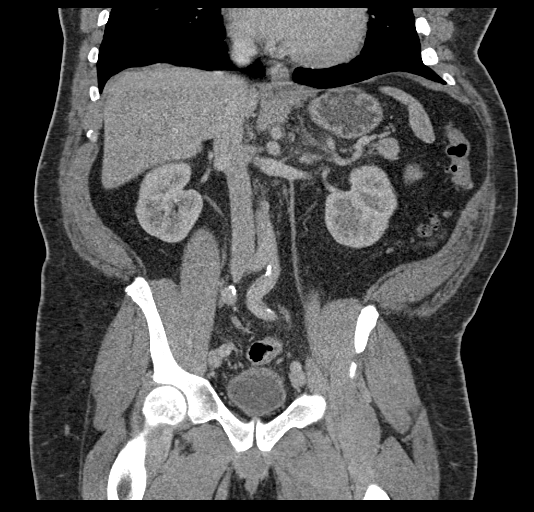

[16 of 46 positions shown; findings below may reference images not displayed]

FINDINGS: Lower chest: The lung bases are clear. The heart size is normal.

Hepatobiliary: The liver is normal. Normal gallbladder.There is no
biliary ductal dilation.

Pancreas: There is peripancreatic fat stranding. The pancreas
enhances uniformly.

Spleen: Unremarkable.

Adrenals/Urinary Tract:

--Adrenal glands: Unremarkable.

--Right kidney/ureter: No hydronephrosis or radiopaque kidney
stones.

--Left kidney/ureter: No hydronephrosis or radiopaque kidney stones.

--Urinary bladder: Unremarkable.

Stomach/Bowel:

--Stomach/Duodenum: No hiatal hernia or other gastric abnormality.
Normal duodenal course and caliber.

--Small bowel: Unremarkable.

--Colon: There is scattered colonic diverticula without CT evidence
for diverticulitis.

--Appendix: Normal.

Vascular/Lymphatic: Atherosclerotic calcification is present within
the non-aneurysmal abdominal aorta, without hemodynamically
significant stenosis.

--No retroperitoneal lymphadenopathy.

--No mesenteric lymphadenopathy.

--No pelvic or inguinal lymphadenopathy.

Reproductive: Unremarkable

Other: No ascites or free air. The abdominal wall is normal.

Musculoskeletal. There are multiple old healed left-sided rib
fractures. No acute displaced fracture identified on today's study.
IMPRESSION: 1. Peripancreatic fat stranding consistent with acute pancreatitis.
No evidence for pancreatic necrosis.
2. Colonic diverticulosis without CT evidence for diverticulitis.

Aortic Atherosclerosis ([id number]-Q72.2).

## 2022-12-10 ENCOUNTER — Encounter (HOSPITAL_COMMUNITY): Payer: Self-pay

## 2022-12-10 ENCOUNTER — Ambulatory Visit (HOSPITAL_COMMUNITY)
Admission: EM | Admit: 2022-12-10 | Discharge: 2022-12-10 | Disposition: A | Discharge status: Home / Self Care | Payer: BC Managed Care – PPO | Attending: Physician Assistant | Admitting: Physician Assistant

## 2022-12-10 DIAGNOSIS — M5441 Lumbago with sciatica, right side: Secondary | ICD-10-CM

## 2022-12-10 MED ORDER — NAPROXEN 375 MG PO TABS: 375.0000 mg | ORAL_TABLET | Freq: Two times a day (BID) | ORAL | 0 refills | Status: DC

## 2022-12-10 MED ORDER — BACLOFEN 10 MG PO TABS: 10.0000 mg | ORAL_TABLET | Freq: Two times a day (BID) | ORAL | 0 refills | Status: DC | PRN

## 2022-12-10 NOTE — Discharge Instructions (Signed)
Start Naprosyn twice a day.  Do not take NSAIDs with this medication including aspirin, ibuprofen/Advil, naproxen/Aleve.You can you use Tylenol/acetaminophen.  Take baclofen up to twice a day.  This make you sleepy so do not drive or drink alcohol while taking it.  You can continue using heat and topical medications.  If your symptoms or not improving quickly please follow-up with sports medicine for further evaluation.  If anything worsens you have worsening pain, weakness, going to the bathroom on yourself without noticing it, numbness on the inside of your legs you need to be seen immediately.

## 2022-12-10 NOTE — ED Provider Notes (Signed)
[facility]    CSN: [id number] Arrival date & time: 12/10/22  1739      History   Chief Complaint Chief Complaint  Patient presents with   Back Pain    HPI Jorge [name] is a 57 y.o. male.   Patient presents today with 3-day history of right-sided lower back pain.  Reports that he was lifting heavy speaker when symptoms began have been persistent since that time.  Pain is rated 2/3 on a 0-10 pain scale when he is leaning forward and resting his back but increases to 8 with prolonged sitting, described as aching with some radiation into his leg described as shooting.  He has tried Tylenol and topical medications without improvement of symptoms.  He denies any bowel/bladder incontinence, lower extremity weakness, saddle anesthesia.  Denies history of malignancy.  Denies previous injury or spinal surgery.  He is having difficulty with daily duties and is missed work several days as result of symptoms.  He is requesting a work excuse note.    Past Medical History:  Diagnosis Date   Allergy    Diabetes mellitus without complication (HCC)    Environmental allergies    Hypertension    Sinus infection     Patient Active Problem List   Diagnosis Date Noted   Essential hypertension 09/06/2018   Prediabetes 09/06/2018   Non-seasonal allergic rhinitis due to pollen 09/06/2018    Past Surgical History:  Procedure Laterality Date   NO PAST SURGERIES         Home Medications    Prior to Admission medications   Medication Sig Start Date End Date Taking? Authorizing Provider  baclofen (LIORESAL) 10 MG tablet Take 1 tablet (10 mg total) by mouth 2 (two) times daily as needed for muscle spasms. 12/10/22  Yes Tasfia Vasseur K, PA-C  naproxen (NAPROSYN) 375 MG tablet Take 1 tablet (375 mg total) by mouth 2 (two) times daily. 12/10/22  Yes Temika Sutphin K, PA-C  atorvastatin (LIPITOR) 40 MG tablet Take 1 tablet (40 mg total) by mouth daily. 09/07/18   Claiborne Rigg, NP   azelastine (ASTELIN) 0.1 % nasal spray Place 2 sprays into both nostrils 2 (two) times daily for 30 days. Use in each nostril as directed 09/06/18 10/06/18  Claiborne Rigg, NP  Blood Glucose Monitoring Suppl (TRUE METRIX GO GLUCOSE METER) w/Device KIT 1 each by Does not apply route every 8 (eight) hours as needed. Patient not taking: Reported on 12/07/2016 10/04/16   Pete Glatter, MD  cetirizine (ZYRTEC) 10 MG tablet Take 1 tablet (10 mg total) by mouth daily. 09/06/18   Claiborne Rigg, NP  glucose blood test strip Use as instructed 09/06/18   Claiborne Rigg, NP  lisinopril-hydrochlorothiazide (PRINZIDE,ZESTORETIC) 10-12.5 MG tablet Take 1 tablet by mouth daily. 09/06/18   Claiborne Rigg, NP  metFORMIN (GLUCOPHAGE) 500 MG tablet Take 1 tablet (500 mg total) by mouth 2 (two) times daily with a meal. 09/06/18   Claiborne Rigg, NP  oxyCODONE-acetaminophen (PERCOCET) 5-325 MG tablet Take 1-2 tablets by mouth every 6 (six) hours as needed. 07/07/20   Geoffery Lyons, MD  TRUEplus Lancets 26G MISC 1 each by Does not apply route every 8 (eight) hours as needed. 09/06/18   Claiborne Rigg, NP    Family History Family History  Problem Relation Age of Onset   Ovarian cancer Mother    Diabetes Father    Colon cancer Neg Hx     Social History  Social History   Tobacco Use   Smoking status: Every Day    Packs/day: .5    Types: Cigarettes   Smokeless tobacco: Never  Vaping Use   Vaping Use: Never used  Substance Use Topics   Alcohol use: Yes    Comment: beer 1-2 times per month per pt.   Drug use: Yes    Types: Marijuana    Comment: daily per pt last 12/05/16     Allergies   Other and Peanut-containing drug products   Review of Systems Review of Systems  Constitutional:  Positive for activity change. Negative for appetite change, fatigue and fever.  Gastrointestinal:  Negative for abdominal pain, diarrhea, nausea and vomiting.  Genitourinary:  Negative for decreased urine volume  and difficulty urinating.  Musculoskeletal:  Positive for back pain. Negative for arthralgias and myalgias.  Neurological:  Negative for weakness and numbness.     Physical Exam Triage Vital Signs ED Triage Vitals  Enc Vitals Group     BP 12/10/22 1827 113/60     Pulse --      Resp 12/10/22 1827 16     Temp 12/10/22 1827 98.6 F (37 C)     Temp src --      SpO2 12/10/22 1827 98 %     Weight --      Height --      Head Circumference --      Peak Flow --      Pain Score 12/10/22 1825 6     Pain Loc --      Pain Edu? --      Excl. in GC? --    No data found.  Updated Vital Signs BP 113/60   Temp 98.6 F (37 C)   Resp 16   SpO2 98%   Visual Acuity Right Eye Distance:   Left Eye Distance:   Bilateral Distance:    Right Eye Near:   Left Eye Near:    Bilateral Near:     Physical Exam Vitals reviewed.  Constitutional:      General: He is awake.     Appearance: Normal appearance. He is well-developed. He is not ill-appearing.     Comments: Very pleasant male appears stated age in no acute distress Laying across exam room table  HENT:     Head: Normocephalic and atraumatic.  Cardiovascular:     Rate and Rhythm: Normal rate and regular rhythm.     Heart sounds: Normal heart sounds, S1 normal and S2 normal. No murmur heard. Pulmonary:     Effort: Pulmonary effort is normal.     Breath sounds: Normal breath sounds. No stridor. No wheezing, rhonchi or rales.     Comments: Clear to auscultation bilaterally Abdominal:     General: Bowel sounds are normal.     Palpations: Abdomen is soft.     Tenderness: There is no abdominal tenderness. There is no right CVA tenderness, left CVA tenderness, guarding or rebound.  Musculoskeletal:     Cervical back: No tenderness or bony tenderness.     Thoracic back: No tenderness or bony tenderness.     Lumbar back: Tenderness present. No bony tenderness. Negative right straight leg raise test and negative left straight leg raise  test.       Back:     Comments: Back: Pain percussion of vertebrae.  No deformity or step-off noted.  Tenderness palpation of right paraspinal muscles.  Negative straight leg raise bilaterally.  Strength 5/5 bilateral lower  extremities.  Neurological:     Mental Status: He is alert.  Psychiatric:        Behavior: Behavior is cooperative.      UC Treatments / Results  Labs (all labs ordered are listed, but only abnormal results are displayed) Labs Reviewed - No data to display  EKG   Radiology No results found.  Procedures Procedures (including critical care time)  Medications Ordered in UC Medications - No data to display  Initial Impression / Assessment and Plan / UC Course  I have reviewed the triage vital signs and the nursing notes.  Pertinent labs & imaging results that were available during my care of the patient were reviewed by me and considered in my medical decision making (see chart for details).     Patient is well-appearing, afebrile, nontoxic, nontachycardic.  Physical exam is reassuring with no indication for emergent evaluation or imaging.  Plain films were deferred as patient has no bony tenderness and denies recent trauma.  Will start him on Naprosyn twice daily.  Discussed that he is not to take NSAIDs with this medication due to risk of GI bleeding but can continue using acetaminophen/Tylenol for breakthrough pain.  He was also given baclofen for pain relief.  Discussed that this can be sedating and he is not to drive or drink alcohol with taking it.  Encouraged him to use heat, rest, stretch for additional symptom relief.  If his symptoms are not improving quickly he is to follow-up with sports medicine and was given contact information for local provider with instruction to call to schedule an appointment.  Discussed that if he has worsening symptoms including increasing pain, development of fever, numbness or paresthesias, saddle anesthesia, bowel/bladder  incontinence he needs to be seen immediately.  Strict return precautions given.  Work excuse note provided.  Final Clinical Impressions(s) / UC Diagnoses   Final diagnoses:  Acute right-sided low back pain with right-sided sciatica     Discharge Instructions      Start Naprosyn twice a day.  Do not take NSAIDs with this medication including aspirin, ibuprofen/Advil, naproxen/Aleve.You can you use Tylenol/acetaminophen.  Take baclofen up to twice a day.  This make you sleepy so do not drive or drink alcohol while taking it.  You can continue using heat and topical medications.  If your symptoms or not improving quickly please follow-up with sports medicine for further evaluation.  If anything worsens you have worsening pain, weakness, going to the bathroom on yourself without noticing it, numbness on the inside of your legs you need to be seen immediately.     ED Prescriptions     Medication Sig Dispense Auth. Provider   baclofen (LIORESAL) 10 MG tablet Take 1 tablet (10 mg total) by mouth 2 (two) times daily as needed for muscle spasms. 14 each Jefte Carithers K, PA-C   naproxen (NAPROSYN) 375 MG tablet Take 1 tablet (375 mg total) by mouth 2 (two) times daily. 20 tablet Mazel Villela, Noberto Retort, PA-C      PDMP not reviewed this encounter.   Jeani Hawking, PA-C 12/10/22 1856

## 2022-12-10 NOTE — ED Triage Notes (Addendum)
Pt presents to uc with co of right sided low back pain since lifting up a heavy speaker. Pt reports using Childrens tylenol for pain. Pain worse with sitting

## 2023-02-11 ENCOUNTER — Encounter: Payer: Self-pay | Admitting: Family Medicine

## 2023-02-11 ENCOUNTER — Other Ambulatory Visit: Payer: Self-pay | Admitting: Family Medicine

## 2023-02-11 DIAGNOSIS — K4090 Unilateral inguinal hernia, without obstruction or gangrene, not specified as recurrent: Secondary | ICD-10-CM

## 2023-03-04 ENCOUNTER — Inpatient Hospital Stay: Admission: RE | Admit: 2023-03-04 | Payer: BC Managed Care – PPO | Source: Ambulatory Visit

## 2023-03-07 ENCOUNTER — Ambulatory Visit
Admission: RE | Admit: 2023-03-07 | Discharge: 2023-03-07 | Disposition: A | Discharge status: Home / Self Care | Payer: BC Managed Care – PPO | Source: Ambulatory Visit | Attending: Family Medicine | Admitting: Family Medicine

## 2023-03-07 DIAGNOSIS — K4090 Unilateral inguinal hernia, without obstruction or gangrene, not specified as recurrent: Secondary | ICD-10-CM

## 2023-03-07 MED ORDER — IOPAMIDOL (ISOVUE-300) INJECTION 61%
100.0000 mL | Freq: Once | INTRAVENOUS | Status: AC | PRN
  Administered 2023-03-07: 100 mL via INTRAVENOUS

## 2023-04-18 ENCOUNTER — Other Ambulatory Visit: Payer: Self-pay | Admitting: Surgery

## 2023-04-18 NOTE — Progress Notes (Signed)
 REFERRING PHYSICIAN:  Quin Stabs, NP PROVIDER:  VICENTA DASIE POLI, MD MRN: [id number] DOB: [birth year] DATE OF ENCOUNTER: 04/18/2023 Subjective    Chief Complaint: New Consultation (hernia)   History of Present Illness: Jorge [name] is a 57 y.o. male who is seen today as an office consultation for evaluation of New Consultation (hernia)   This is a pleasant 57 year old gentleman is referred here for a possible right inguinal hernia.  He was doing heavy lifting at work when he started having burning discomfort in the right inguinal area.  He reports it is worse with twisting.  He underwent a CT scan of the abdomen and pelvis which was unremarkable and showed no evidence of hernia.  He reports he still has the pain when sitting but has not noticed a specific bulge.  Again, the pain is a sharp burning and can be moderate to severe.  He has tried some ibuprofen  with only some relief    Review of Systems: A complete review of systems was obtained from the patient.  I have reviewed this information and discussed as appropriate with the patient.  See HPI as well for other ROS.  ROS   Medical History: Past Medical History:  Diagnosis Date  . Diabetes mellitus without complication (CMS/HHS-HCC)     There is no problem list on file for this patient.   History reviewed. No pertinent surgical history.   No Known Allergies  Current Outpatient Medications on File Prior to Visit  Medication Sig Dispense Refill  . metFORMIN  (GLUCOPHAGE ) 500 MG tablet Take 500 mg by mouth 2 (two) times daily with meals     No current facility-administered medications on file prior to visit.    Family History  Problem Relation Age of Onset  . Stroke Mother   . Diabetes Father      Social History   Tobacco Use  Smoking Status Every Day  . Types: Cigarettes  Smokeless Tobacco Never     Social History   Socioeconomic History  . Marital status: Single  Tobacco Use  . Smoking status:  Every Day    Types: Cigarettes  . Smokeless tobacco: Never  Vaping Use  . Vaping status: Never Used  Substance and Sexual Activity  . Alcohol use: Not Currently  . Drug use: Defer    Objective:   Vitals:   04/18/23 1529  BP: 136/88  Pulse: 103  Temp: 36.7 C (98 F)  SpO2: 96%  Weight: 83.3 kg (183 lb 9.6 oz)  Height: 166.4 cm (5' 5.5)  PainSc:   4  PainLoc: Groin    Body mass index is 30.09 kg/m.  Physical Exam   He appears well on exam  He is tender in the right inguinal area but with him both lying and standing with vigorous Valsalva maneuvers I cannot feel a bulge.  Labs, Imaging and Diagnostic Testing: I reviewed the CAT scan of his abdomen and pelvis  Assessment and Plan:     Diagnoses and all orders for this visit:  Inguinal pain, right    This may be an early hernia but is very difficult to tell as I do not feel a bulge.  This could still be a muscle strain.  We discussed surgical expiration of the area versus trying anti-inflammatories.  I recommend we try Voltaren gel twice a day as well as heating pad and ice pack to the area and to refrain from heavy lifting for the next several weeks and I will then see  him back in a month to reevaluate and examine a groin.  Should he develop worsening discomfort or notices a bulge in the area he will call and I will get him back in a soon as possible. He understands and agrees with the plans  Moderate medical decision making    DOUGLAS DASIE POLI, MD

## 2024-01-04 ENCOUNTER — Encounter (HOSPITAL_COMMUNITY): Payer: Self-pay

## 2024-01-04 ENCOUNTER — Emergency Department (HOSPITAL_COMMUNITY): Payer: Self-pay

## 2024-01-04 ENCOUNTER — Inpatient Hospital Stay (HOSPITAL_COMMUNITY)
Admission: EM | Admit: 2024-01-04 | Discharge: 2024-01-06 | DRG: 384 | Disposition: A | Discharge status: Home / Self Care | Payer: Self-pay | Source: Other Acute Inpatient Hospital | Attending: Internal Medicine | Admitting: Internal Medicine

## 2024-01-04 ENCOUNTER — Ambulatory Visit (HOSPITAL_COMMUNITY)
Admission: EM | Admit: 2024-01-04 | Discharge: 2024-01-04 | Disposition: A | Discharge status: Home / Self Care | Payer: Self-pay | Attending: Internal Medicine | Admitting: Internal Medicine

## 2024-01-04 ENCOUNTER — Encounter (HOSPITAL_COMMUNITY): Payer: Self-pay | Admitting: Pharmacy Technician

## 2024-01-04 DIAGNOSIS — R1012 Left upper quadrant pain: Secondary | ICD-10-CM

## 2024-01-04 DIAGNOSIS — R0982 Postnasal drip: Secondary | ICD-10-CM | POA: Diagnosis present

## 2024-01-04 DIAGNOSIS — D751 Secondary polycythemia: Secondary | ICD-10-CM | POA: Diagnosis present

## 2024-01-04 DIAGNOSIS — Z7984 Long term (current) use of oral hypoglycemic drugs: Secondary | ICD-10-CM

## 2024-01-04 DIAGNOSIS — Z91148 Patient's other noncompliance with medication regimen for other reason: Secondary | ICD-10-CM

## 2024-01-04 DIAGNOSIS — R748 Abnormal levels of other serum enzymes: Secondary | ICD-10-CM | POA: Diagnosis present

## 2024-01-04 DIAGNOSIS — K259 Gastric ulcer, unspecified as acute or chronic, without hemorrhage or perforation: Secondary | ICD-10-CM

## 2024-01-04 DIAGNOSIS — E876 Hypokalemia: Secondary | ICD-10-CM | POA: Diagnosis not present

## 2024-01-04 DIAGNOSIS — F1011 Alcohol abuse, in remission: Secondary | ICD-10-CM | POA: Diagnosis present

## 2024-01-04 DIAGNOSIS — I700 Atherosclerosis of aorta: Secondary | ICD-10-CM | POA: Diagnosis present

## 2024-01-04 DIAGNOSIS — N179 Acute kidney failure, unspecified: Secondary | ICD-10-CM | POA: Diagnosis present

## 2024-01-04 DIAGNOSIS — J301 Allergic rhinitis due to pollen: Secondary | ICD-10-CM | POA: Diagnosis present

## 2024-01-04 DIAGNOSIS — K5900 Constipation, unspecified: Secondary | ICD-10-CM | POA: Diagnosis present

## 2024-01-04 DIAGNOSIS — Z833 Family history of diabetes mellitus: Secondary | ICD-10-CM

## 2024-01-04 DIAGNOSIS — F17210 Nicotine dependence, cigarettes, uncomplicated: Secondary | ICD-10-CM | POA: Diagnosis present

## 2024-01-04 DIAGNOSIS — Z6834 Body mass index (BMI) 34.0-34.9, adult: Secondary | ICD-10-CM | POA: Diagnosis not present

## 2024-01-04 DIAGNOSIS — E860 Dehydration: Secondary | ICD-10-CM | POA: Diagnosis present

## 2024-01-04 DIAGNOSIS — R739 Hyperglycemia, unspecified: Secondary | ICD-10-CM

## 2024-01-04 DIAGNOSIS — K2980 Duodenitis without bleeding: Secondary | ICD-10-CM | POA: Diagnosis present

## 2024-01-04 DIAGNOSIS — Z8041 Family history of malignant neoplasm of ovary: Secondary | ICD-10-CM

## 2024-01-04 DIAGNOSIS — E1165 Type 2 diabetes mellitus with hyperglycemia: Secondary | ICD-10-CM

## 2024-01-04 DIAGNOSIS — K2100 Gastro-esophageal reflux disease with esophagitis, without bleeding: Secondary | ICD-10-CM | POA: Diagnosis present

## 2024-01-04 DIAGNOSIS — E119 Type 2 diabetes mellitus without complications: Secondary | ICD-10-CM | POA: Diagnosis present

## 2024-01-04 DIAGNOSIS — B9681 Helicobacter pylori [H. pylori] as the cause of diseases classified elsewhere: Secondary | ICD-10-CM | POA: Diagnosis present

## 2024-01-04 DIAGNOSIS — K257 Chronic gastric ulcer without hemorrhage or perforation: Secondary | ICD-10-CM | POA: Diagnosis present

## 2024-01-04 DIAGNOSIS — S032XXA Dislocation of tooth, initial encounter: Secondary | ICD-10-CM | POA: Diagnosis not present

## 2024-01-04 DIAGNOSIS — R17 Unspecified jaundice: Secondary | ICD-10-CM | POA: Diagnosis present

## 2024-01-04 DIAGNOSIS — R7303 Prediabetes: Secondary | ICD-10-CM | POA: Diagnosis present

## 2024-01-04 DIAGNOSIS — K5730 Diverticulosis of large intestine without perforation or abscess without bleeding: Secondary | ICD-10-CM | POA: Diagnosis present

## 2024-01-04 DIAGNOSIS — I10 Essential (primary) hypertension: Secondary | ICD-10-CM | POA: Diagnosis present

## 2024-01-04 DIAGNOSIS — R112 Nausea with vomiting, unspecified: Secondary | ICD-10-CM

## 2024-01-04 DIAGNOSIS — E8809 Other disorders of plasma-protein metabolism, not elsewhere classified: Secondary | ICD-10-CM | POA: Diagnosis present

## 2024-01-04 DIAGNOSIS — R933 Abnormal findings on diagnostic imaging of other parts of digestive tract: Secondary | ICD-10-CM

## 2024-01-04 DIAGNOSIS — A059 Bacterial foodborne intoxication, unspecified: Secondary | ICD-10-CM | POA: Diagnosis present

## 2024-01-04 DIAGNOSIS — R1010 Upper abdominal pain, unspecified: Secondary | ICD-10-CM | POA: Diagnosis not present

## 2024-01-04 DIAGNOSIS — R1013 Epigastric pain: Secondary | ICD-10-CM

## 2024-01-04 DIAGNOSIS — T8189XA Other complications of procedures, not elsewhere classified, initial encounter: Secondary | ICD-10-CM | POA: Diagnosis not present

## 2024-01-04 DIAGNOSIS — Z79899 Other long term (current) drug therapy: Secondary | ICD-10-CM

## 2024-01-04 DIAGNOSIS — R61 Generalized hyperhidrosis: Secondary | ICD-10-CM

## 2024-01-04 DIAGNOSIS — E66811 Obesity, class 1: Secondary | ICD-10-CM | POA: Diagnosis present

## 2024-01-04 LAB — CBC
HCT: 53.2 % — ABNORMAL HIGH (ref 39.0–52.0)
Hemoglobin: 17.8 g/dL — ABNORMAL HIGH (ref 13.0–17.0)
MCH: 29.8 pg (ref 26.0–34.0)
MCHC: 33.5 g/dL (ref 30.0–36.0)
MCV: 89.1 fL (ref 80.0–100.0)
Platelets: 256 K/uL (ref 150–400)
RBC: 5.97 MIL/uL — ABNORMAL HIGH (ref 4.22–5.81)
RDW: 14.6 % (ref 11.5–15.5)
WBC: 19.3 K/uL — ABNORMAL HIGH (ref 4.0–10.5)
nRBC: 0 % (ref 0.0–0.2)

## 2024-01-04 LAB — COMPREHENSIVE METABOLIC PANEL WITH GFR
ALT: 28 U/L (ref 0–44)
AST: 33 U/L (ref 15–41)
Albumin: 3.9 g/dL (ref 3.5–5.0)
Alkaline Phosphatase: 54 U/L (ref 38–126)
Anion gap: 16 — ABNORMAL HIGH (ref 5–15)
BUN: 22 mg/dL — ABNORMAL HIGH (ref 6–20)
CO2: 30 mmol/L (ref 22–32)
Calcium: 9.2 mg/dL (ref 8.9–10.3)
Chloride: 91 mmol/L — ABNORMAL LOW (ref 98–111)
Creatinine, Ser: 1.56 mg/dL — ABNORMAL HIGH (ref 0.61–1.24)
GFR, Estimated: 51 mL/min — ABNORMAL LOW (ref >60)
Glucose, Bld: 163 mg/dL — ABNORMAL HIGH (ref 70–99)
Potassium: 3.5 mmol/L (ref 3.5–5.1)
Sodium: 137 mmol/L (ref 135–145)
Total Bilirubin: 1.5 mg/dL — ABNORMAL HIGH (ref 0.0–1.2)
Total Protein: 7.6 g/dL (ref 6.5–8.1)

## 2024-01-04 LAB — POCT FASTING CBG KUC MANUAL ENTRY: POCT Glucose (KUC): 229 mg/dL — AB (ref 70–99)

## 2024-01-04 LAB — LIPASE, BLOOD: Lipase: 59 U/L — ABNORMAL HIGH (ref 11–51)

## 2024-01-04 MED ORDER — PANTOPRAZOLE SODIUM 40 MG IV SOLR
40.0000 mg | INTRAVENOUS | Status: DC
  Administered 2024-01-05 – 2024-01-06 (×2): 40 mg via INTRAVENOUS
  Filled 2024-01-04 (×2): qty 10

## 2024-01-04 MED ORDER — THIAMINE MONONITRATE 100 MG PO TABS
100.0000 mg | ORAL_TABLET | Freq: Every day | ORAL | Status: DC
  Administered 2024-01-04 – 2024-01-06 (×2): 100 mg via ORAL
  Filled 2024-01-04 (×2): qty 1

## 2024-01-04 MED ORDER — ONDANSETRON HCL 4 MG/2ML IJ SOLN
INTRAMUSCULAR | Status: AC
  Filled 2024-01-04: qty 2

## 2024-01-04 MED ORDER — MORPHINE SULFATE (PF) 2 MG/ML IV SOLN: 1.0000 mg | Freq: Once | INTRAVENOUS | Status: DC

## 2024-01-04 MED ORDER — IOHEXOL 350 MG/ML SOLN
75.0000 mL | Freq: Once | INTRAVENOUS | Status: AC | PRN
  Administered 2024-01-04: 75 mL via INTRAVENOUS

## 2024-01-04 MED ORDER — MORPHINE SULFATE (PF) 2 MG/ML IV SOLN
INTRAVENOUS | Status: AC
Start: 2024-01-04 — End: 2024-01-04
  Filled 2024-01-04: qty 1

## 2024-01-04 MED ORDER — OXYMETAZOLINE HCL 0.05 % NA SOLN
1.0000 | Freq: Two times a day (BID) | NASAL | Status: DC
  Administered 2024-01-05 (×3): 1 via NASAL
  Filled 2024-01-04: qty 30

## 2024-01-04 MED ORDER — FLUTICASONE PROPIONATE 50 MCG/ACT NA SUSP
1.0000 | Freq: Every day | NASAL | Status: DC
  Administered 2024-01-06: 1 via NASAL
  Filled 2024-01-04 (×2): qty 16

## 2024-01-04 MED ORDER — INSULIN ASPART 100 UNIT/ML IJ SOLN: 0.0000 [IU] | Freq: Three times a day (TID) | INTRAMUSCULAR | Status: DC

## 2024-01-04 MED ORDER — ONDANSETRON HCL 4 MG/2ML IJ SOLN
4.0000 mg | Freq: Once | INTRAMUSCULAR | Status: AC
  Administered 2024-01-04: 4 mg via INTRAVENOUS
  Filled 2024-01-04: qty 2

## 2024-01-04 MED ORDER — HYDRALAZINE HCL 20 MG/ML IJ SOLN
10.0000 mg | INTRAMUSCULAR | Status: DC | PRN
  Administered 2024-01-05: 10 mg via INTRAVENOUS
  Filled 2024-01-04: qty 1

## 2024-01-04 MED ORDER — LACTATED RINGERS IV SOLN: INTRAVENOUS | Status: AC

## 2024-01-04 MED ORDER — ONDANSETRON HCL 4 MG/2ML IJ SOLN
4.0000 mg | Freq: Once | INTRAMUSCULAR | Status: AC
  Administered 2024-01-04: 4 mg via INTRAVENOUS

## 2024-01-04 MED ORDER — SODIUM CHLORIDE 0.9 % IV BOLUS
1000.0000 mL | Freq: Once | INTRAVENOUS | Status: AC
  Administered 2024-01-04: 1000 mL via INTRAVENOUS

## 2024-01-04 MED ORDER — HYDROMORPHONE HCL 1 MG/ML IJ SOLN
0.5000 mg | INTRAMUSCULAR | Status: DC | PRN
  Administered 2024-01-04 – 2024-01-06 (×4): 0.5 mg via INTRAVENOUS
  Filled 2024-01-04 (×4): qty 0.5

## 2024-01-04 MED ORDER — POTASSIUM CHLORIDE 10 MEQ/100ML IV SOLN
10.0000 meq | INTRAVENOUS | Status: AC
  Administered 2024-01-04 (×3): 10 meq via INTRAVENOUS
  Filled 2024-01-04 (×3): qty 100

## 2024-01-04 MED ORDER — PANTOPRAZOLE SODIUM 40 MG IV SOLR
40.0000 mg | Freq: Once | INTRAVENOUS | Status: AC
  Administered 2024-01-04: 40 mg via INTRAVENOUS
  Filled 2024-01-04: qty 10

## 2024-01-04 MED ORDER — ONDANSETRON HCL 4 MG PO TABS: 4.0000 mg | ORAL_TABLET | Freq: Four times a day (QID) | ORAL | Status: DC | PRN

## 2024-01-04 MED ORDER — MORPHINE SULFATE (PF) 2 MG/ML IV SOLN
2.0000 mg | Freq: Once | INTRAVENOUS | Status: AC
  Administered 2024-01-04: 2 mg via INTRAVENOUS

## 2024-01-04 MED ORDER — DOCUSATE SODIUM 100 MG PO CAPS
100.0000 mg | ORAL_CAPSULE | Freq: Two times a day (BID) | ORAL | Status: DC
  Administered 2024-01-05 – 2024-01-06 (×2): 100 mg via ORAL
  Filled 2024-01-04 (×2): qty 1

## 2024-01-04 MED ORDER — ONDANSETRON HCL 4 MG/2ML IJ SOLN: 4.0000 mg | Freq: Four times a day (QID) | INTRAMUSCULAR | Status: DC | PRN

## 2024-01-04 MED ORDER — FOLIC ACID 1 MG PO TABS
1.0000 mg | ORAL_TABLET | Freq: Every day | ORAL | Status: DC
  Administered 2024-01-04 – 2024-01-06 (×2): 1 mg via ORAL
  Filled 2024-01-04 (×2): qty 1

## 2024-01-04 MED ORDER — FENTANYL CITRATE PF 50 MCG/ML IJ SOSY
50.0000 ug | PREFILLED_SYRINGE | Freq: Once | INTRAMUSCULAR | Status: AC
  Administered 2024-01-04: 50 ug via INTRAVENOUS
  Filled 2024-01-04: qty 1

## 2024-01-04 MED ORDER — BISACODYL 5 MG PO TBEC
5.0000 mg | DELAYED_RELEASE_TABLET | Freq: Once | ORAL | Status: AC
  Administered 2024-01-04: 5 mg via ORAL
  Filled 2024-01-04: qty 1

## 2024-01-04 MED ORDER — NALOXONE HCL 0.4 MG/ML IJ SOLN: 0.4000 mg | INTRAMUSCULAR | Status: DC | PRN

## 2024-01-04 MED ORDER — MORPHINE SULFATE (PF) 2 MG/ML IV SOLN
INTRAVENOUS | Status: AC
  Filled 2024-01-04: qty 1

## 2024-01-04 NOTE — Progress Notes (Signed)
 [facility] night cross cover note:  I added prn iv dilaudid  for residual left upper quadrant and epigastric pain.    Eva Pore, DO Hospitalist

## 2024-01-04 NOTE — ED Provider Triage Note (Signed)
 Emergency Medicine Provider Triage Evaluation Note  Jorge [name] , a 58 y.o. male  was evaluated in triage.  Pt complains of abdominal pain and vomiting for the last 3 days.  Review of Systems  Positive: Nausea, vomiting, epigastric abdominal pain Negative: Fever  Physical Exam  BP (!) 134/110 (BP Location: Left Arm)   Pulse 99   Temp 98.6 F (37 C) (Oral)   Resp 15   SpO2 96%  Gen:   Awake, dry heaving Resp:  Normal effort  MSK:   Moves extremities without difficulty  Other:  Abdominal distention and tenderness to palpation in the epigastric region  Medical Decision Making  Medically screening exam initiated at 12:22 PM.  Appropriate orders placed.  Antonino Nienhuis was informed that the remainder of the evaluation will be completed by another provider, this initial triage assessment does not replace that evaluation, and the importance of remaining in the ED until their evaluation is complete.     Afsana Liera, DO 01/04/24 1223

## 2024-01-04 NOTE — ED Notes (Signed)
 Floor notified patient was coming up. Transport on the way.

## 2024-01-04 NOTE — ED Triage Notes (Signed)
 Pt c/o vomiting since 2am Tuesday. States ate a hamburger prior to that from the 4th. States unable to keep anything down. Pt c/o sinus pressure and congestion x1wk. States having dizziness since yesterday. Pt is diaphoretic on arrival, states started when he in the car on the way here. States hasn't had is medicines since January.

## 2024-01-04 NOTE — ED Triage Notes (Signed)
 Pt here via [facility] from UC with complaints of abd pain and nausea since yesterday morning. Given 4mg  Zofran  and 4mg  Morphine  along with 1L NS at UC.  149/107 HR 80 98% RA

## 2024-01-04 NOTE — ED Notes (Signed)
 Patient is being discharged from the Urgent Care and sent to the Emergency Department via [facility] . Per Michelle,NP, patient is in need of higher level of care due to HTN, abdominal pain. Patient is aware and verbalizes understanding of plan of care.  Vitals:   01/04/24 1122 01/04/24 1139  BP: (!) 191/114 (!) 161/107  Pulse: 92 97  Resp:  16  Temp:    SpO2:  97%

## 2024-01-04 NOTE — H&P (Signed)
 History and Physical    Patient: Jorge [name] FMW:[id number] DOB: 08-23-1965 DOA: 01/04/2024 DOS: the patient was seen and examined on 01/04/2024 PCP: Theotis Haze ORN, NP  Patient coming from: Home  Chief Complaint: No chief complaint on file.  HPI: Jorge [name] is a 58 y.o. male with medical history significant of hypertension, prediabetes, and recovered alcohol abuse presents with 2 days of intractable vomiting.  The patient reports that he did go to a cookout over the weekend for 4 July.  He ate some leftover food on Monday but he was awakened in the middle of the night with vomiting 2 nights ago and has had recurrent vomiting since that time.  He developed abdominal pain mainly in the epigastric area after his fourth emesis.  The pain is after he vomits or after he burps or even at this point after he moves significantly.  Denies any fevers chills chest pain or shortness of breath.  He does have a distant history of pancreatitis but that was many years ago he quit drinking 6 years ago.  He has not vomited any blood.  He does have trouble with constipation at baseline. Emergency department the patient had a CT of his abdomen pelvis which revealed diffuse wall thickening and narrowing in the area of the distal stomach.  GI has been consulted and the patient will be admitted for continued IV fluids, pain control, and possible EGD in a.m.   Review of Systems: As mentioned in the history of present illness. All other systems reviewed and are negative. Past Medical History:  Diagnosis Date   Allergy    Diabetes mellitus without complication (HCC)    Environmental allergies    Hypertension    Sinus infection    Past Surgical History:  Procedure Laterality Date   NO PAST SURGERIES     Social History:  reports that he has been smoking cigarettes. He has never used smokeless tobacco. He reports that he does not currently use alcohol. He reports that he does not currently use drugs after  having used the following drugs: Marijuana.  Allergies  Allergen Reactions   Other Anaphylaxis    Allergy to dust and dirt if blow in front of his face causing swell of the eyes and short of breath    Peanut-Containing Drug Products Anaphylaxis    Family History  Problem Relation Age of Onset   Ovarian cancer Mother    Diabetes Father    Colon cancer Neg Hx     Prior to Admission medications   Medication Sig Start Date End Date Taking? Authorizing Provider  atorvastatin  (LIPITOR) 40 MG tablet Take 1 tablet (40 mg total) by mouth daily. Patient not taking: Reported on 01/04/2024 09/07/18   Fleming, [name], NP  azelastine  (ASTELIN ) 0.1 % nasal spray Place 2 sprays into both nostrils 2 (two) times daily for 30 days. Use in each nostril as directed Patient not taking: Reported on 01/04/2024 09/06/18 10/06/18  Fleming, [name], NP  baclofen  (LIORESAL ) 10 MG tablet Take 1 tablet (10 mg total) by mouth 2 (two) times daily as needed for muscle spasms. Patient not taking: Reported on 01/04/2024 12/10/22   Raspet, Erin K, PA-C  Blood Glucose Monitoring Suppl (TRUE METRIX GO GLUCOSE METER) w/Device KIT 1 each by Does not apply route every 8 (eight) hours as needed. Patient not taking: Reported on 12/07/2016 10/04/16   Marilyne Morrison T, MD  cetirizine  (ZYRTEC ) 10 MG tablet Take 1 tablet (10 mg total) by mouth daily.  Patient not taking: Reported on 01/04/2024 09/06/18   Theotis Haze ORN, NP  glucose blood test strip Use as instructed 09/06/18   Fleming, [name], NP  lisinopril -hydrochlorothiazide  (PRINZIDE ,ZESTORETIC ) 10-12.5 MG tablet Take 1 tablet by mouth daily. Patient not taking: Reported on 01/04/2024 09/06/18   Fleming, [name], NP  metFORMIN  (GLUCOPHAGE ) 500 MG tablet Take 1 tablet (500 mg total) by mouth 2 (two) times daily with a meal. Patient not taking: Reported on 01/04/2024 09/06/18   Theotis Haze ORN, NP  TRUEplus Lancets 26G MISC 1 each by Does not apply route every 8 (eight) hours as needed. 09/06/18    Theotis Haze ORN, NP    Physical Exam: Vitals:   01/04/24 1208 01/04/24 1614  BP: (!) 134/110 130/88  Pulse: 99 88  Resp: 15 16  Temp: 98.6 F (37 C) 98.5 F (36.9 C)  TempSrc: Oral   SpO2: 96% 97%   Physical Exam:  General: No acute distress, well developed, well nourished HEENT: Normocephalic, atraumatic, PERRL. Left anre has tissue in it to stop his post nasal drip Cardiovascular: Normal rate and rhythm. Distal pulses intact. Pulmonary: Normal pulmonary effort, normal breath sounds Gastrointestinal: Nondistended abdomen, soft, tender in epigastric area and down into LLQ, hypoactive bowel sounds Musculoskeletal:Normal ROM, no lower ext edema Lymphadenopathy: No cervical LAD. Skin: Skin is warm and dry. Neuro: No focal deficits noted, AAOx3. PSYCH: Attentive and cooperative  Data Reviewed:  Results for orders placed or performed during the hospital encounter of 01/04/24 (from the past 24 hours)  Lipase, blood     Status: Abnormal   Collection Time: 01/04/24 12:21 PM  Result Value Ref Range   Lipase 59 (H) 11 - 51 U/L  Comprehensive metabolic panel     Status: Abnormal   Collection Time: 01/04/24 12:21 PM  Result Value Ref Range   Sodium 137 135 - 145 mmol/L   Potassium 3.5 3.5 - 5.1 mmol/L   Chloride 91 (L) 98 - 111 mmol/L   CO2 30 22 - 32 mmol/L   Glucose, Bld 163 (H) 70 - 99 mg/dL   BUN 22 (H) 6 - 20 mg/dL   Creatinine, Ser 8.43 (H) 0.61 - 1.24 mg/dL   Calcium  9.2 8.9 - 10.3 mg/dL   Total Protein 7.6 6.5 - 8.1 g/dL   Albumin 3.9 3.5 - 5.0 g/dL   AST 33 15 - 41 U/L   ALT 28 0 - 44 U/L   Alkaline Phosphatase 54 38 - 126 U/L   Total Bilirubin 1.5 (H) 0.0 - 1.2 mg/dL   GFR, Estimated 51 (L) >60 mL/min   Anion gap 16 (H) 5 - 15  CBC     Status: Abnormal   Collection Time: 01/04/24 12:21 PM  Result Value Ref Range   WBC 19.3 (H) 4.0 - 10.5 K/uL   RBC 5.97 (H) 4.22 - 5.81 MIL/uL   Hemoglobin 17.8 (H) 13.0 - 17.0 g/dL   HCT 46.7 (H) 60.9 - 47.9 %   MCV 89.1  80.0 - 100.0 fL   MCH 29.8 26.0 - 34.0 pg   MCHC 33.5 30.0 - 36.0 g/dL   RDW 85.3 88.4 - 84.4 %   Platelets 256 150 - 400 K/uL   nRBC 0.0 0.0 - 0.2 %     Assessment and Plan:  Intractable vomiting and abnormal CT w swelling of distal stomach -  - [facility] consulted - NPO after midnight for EGD in a.m. - IV Protonix  empirically  2. AKI - IV fluids  3. Pre diabetes vs diabetes -blood sugar was 266 on arrival.   - IV fluids - Check hemoglobin A1c in a.m. - Hold Metformin  due to CT - Corrective dose insulin  as needed  4.  Hypertension -looks like he is on Zestoretic .  An AIIRB would be more effective and not risk the hypokalemia.  5.  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  6. Post nasal drip left nare only  x > 1 month  We will give him 2 doses of Afrin because his high blood pressure precludes decongestants    Advance Care Planning:   Code Status: Full Code the patient names his wife is a surrogate decision maker and he wants to be full code.  Consults: [facility]  Family Communication: Patient's wife is at bedside  Severity of Illness: The appropriate patient status for this patient is INPATIENT. Inpatient status is judged to be reasonable and necessary in order to provide the required intensity of service to ensure the patient's safety. The patient's presenting symptoms, physical exam findings, and initial radiographic and laboratory data in the context of their chronic comorbidities is felt to place them at high risk for further clinical deterioration. Furthermore, it is not anticipated that the patient will be medically stable for discharge from the hospital within 2 midnights of admission.   * I certify that at the point of admission it is my clinical judgment that the patient will require inpatient hospital care spanning beyond 2 midnights from the point of admission due to high intensity of service, high risk for  further deterioration and high frequency of surveillance required.*  Author: ARTHEA CHILD, MD 01/04/2024 6:40 PM  For on call review [email or web address].

## 2024-01-04 NOTE — Progress Notes (Signed)
 [facility] night cross cover note:   I was notified by the patient's RN of the patient's most recent blood pressure of 164/111, with corresponding heart rates in the low 90s.  Other vital signs appear stable at this time, including afebrile, respiratory rate 16, and oxygen saturation in the high 90s on room air.  RN conveys that the patient has a history of essential hypertension.  No additional new symptoms reported at this time.  Patient is n.p.o. in the setting of plan for EGD in the morning.  I subsequently ordered prn IV hydralazine  for systolic blood pressure greater than 170 or diastolic blood pressure greater than 100 mmHg.      Eva Pore, DO Hospitalist

## 2024-01-04 NOTE — ED Provider Notes (Signed)
  EMERGENCY DEPARTMENT AT [facility] Provider Note   CSN: [id number] Arrival date & time: 01/04/24  1203     Patient presents with: No chief complaint on file.   Jorge [name] is a 58 y.o. male.   Patient is a 58 year old male who presents with nausea and vomiting.  He states he has had some drainage out of his left nare for about a month.  He says it has been feeling congested in his left nare.  2 days ago he started having nausea and vomiting.  His emesis is nonbloody and nonbilious.  He is develop some associated abdominal pain, mostly in the epigastrium and left upper abdomen.  The abdominal pain started after the vomiting.  He has not had any change in his stools.  No fevers.  No cough or congestion in his chest.  No sinus pain or ongoing headaches.  No history of similar symptoms in the past.  He does have a history of diabetes and hypertension but has been out of his medications for a while due to lack of primary care physician.       Prior to Admission medications   Medication Sig Start Date End Date Taking? Authorizing Provider  atorvastatin  (LIPITOR) 40 MG tablet Take 1 tablet (40 mg total) by mouth daily. Patient not taking: Reported on 01/04/2024 09/07/18   Fleming, [name], NP  azelastine  (ASTELIN ) 0.1 % nasal spray Place 2 sprays into both nostrils 2 (two) times daily for 30 days. Use in each nostril as directed Patient not taking: Reported on 01/04/2024 09/06/18 10/06/18  Fleming, [name], NP  baclofen  (LIORESAL ) 10 MG tablet Take 1 tablet (10 mg total) by mouth 2 (two) times daily as needed for muscle spasms. Patient not taking: Reported on 01/04/2024 12/10/22   Raspet, Erin K, PA-C  Blood Glucose Monitoring Suppl (TRUE METRIX GO GLUCOSE METER) w/Device KIT 1 each by Does not apply route every 8 (eight) hours as needed. Patient not taking: Reported on 12/07/2016 10/04/16   Marilyne Stephane DASEN, MD  cetirizine  (ZYRTEC ) 10 MG tablet Take 1 tablet (10 mg total) by  mouth daily. Patient not taking: Reported on 01/04/2024 09/06/18   Theotis Haze ORN, NP  glucose blood test strip Use as instructed 09/06/18   Fleming, [name], NP  lisinopril -hydrochlorothiazide  (PRINZIDE ,ZESTORETIC ) 10-12.5 MG tablet Take 1 tablet by mouth daily. Patient not taking: Reported on 01/04/2024 09/06/18   Fleming, [name], NP  metFORMIN  (GLUCOPHAGE ) 500 MG tablet Take 1 tablet (500 mg total) by mouth 2 (two) times daily with a meal. Patient not taking: Reported on 01/04/2024 09/06/18   Theotis Haze ORN, NP  TRUEplus Lancets 26G MISC 1 each by Does not apply route every 8 (eight) hours as needed. 09/06/18   Fleming, [name], NP    Allergies: Other and Peanut-containing drug products    Review of Systems  Constitutional:  Positive for fatigue.  HENT:  Positive for congestion and rhinorrhea. Negative for sinus pressure.   Respiratory:  Negative for shortness of breath.   Cardiovascular:  Negative for chest pain.  Gastrointestinal:  Positive for abdominal pain, nausea and vomiting. Negative for diarrhea.  Genitourinary:  Negative for dysuria.  Musculoskeletal:  Negative for arthralgias and back pain.  Neurological:  Negative for headaches.    Updated Vital Signs BP (!) 140/90   Pulse 80   Temp 98.4 F (36.9 C)   Resp 16   SpO2 98%   Physical Exam Constitutional:  Appearance: He is well-developed.  HENT:     Head: Normocephalic and atraumatic.  Eyes:     Pupils: Pupils are equal, round, and reactive to light.  Cardiovascular:     Rate and Rhythm: Normal rate and regular rhythm.     Heart sounds: Normal heart sounds.  Pulmonary:     Effort: Pulmonary effort is normal. No respiratory distress.     Breath sounds: Normal breath sounds. No wheezing or rales.  Chest:     Chest wall: No tenderness.  Abdominal:     General: Bowel sounds are normal.     Palpations: Abdomen is soft.     Tenderness: There is abdominal tenderness (Tenderness to the epigastrium and left upper  quadrant). There is no guarding or rebound.  Musculoskeletal:        General: Normal range of motion.     Cervical back: Normal range of motion and neck supple.  Lymphadenopathy:     Cervical: No cervical adenopathy.  Skin:    General: Skin is warm and dry.     Findings: No rash.  Neurological:     Mental Status: He is alert and oriented to person, place, and time.     (all labs ordered are listed, but only abnormal results are displayed) Labs Reviewed  LIPASE, BLOOD - Abnormal; Notable for the following components:      Result Value   Lipase 59 (*)    All other components within normal limits  COMPREHENSIVE METABOLIC PANEL WITH GFR - Abnormal; Notable for the following components:   Chloride 91 (*)    Glucose, Bld 163 (*)    BUN 22 (*)    Creatinine, Ser 1.56 (*)    Total Bilirubin 1.5 (*)    GFR, Estimated 51 (*)    Anion gap 16 (*)    All other components within normal limits  CBC - Abnormal; Notable for the following components:   WBC 19.3 (*)    RBC 5.97 (*)    Hemoglobin 17.8 (*)    HCT 53.2 (*)    All other components within normal limits  URINALYSIS, ROUTINE W REFLEX MICROSCOPIC  HIV ANTIBODY (ROUTINE TESTING W REFLEX)  COMPREHENSIVE METABOLIC PANEL WITH GFR  HEMOGLOBIN A1C  MAGNESIUM    EKG: EKG Interpretation Date/Time:  Wednesday January 04 2024 11:20:06 EDT Ventricular Rate:  99 PR Interval:  124 QRS Duration:  82 QT Interval:  390 QTC Calculation: 500 R Axis:   47  Text Interpretation: Normal sinus rhythm Possible Left atrial enlargement Left ventricular hypertrophy with repolarization abnormality ( Cornell product , Romhilt-Estes ) Prolonged QT Abnormal ECG Confirmed by Darliss Rogue ([id number]) on 01/04/2024 5:14:59 PM  Radiology: CT ABDOMEN PELVIS W CONTRAST Result Date: 01/04/2024 CLINICAL DATA:  Abdominal pain, acute, nonlocalized. Abdominal pain and vomiting for 3 days. EXAM: CT ABDOMEN AND PELVIS WITH CONTRAST TECHNIQUE: Multidetector CT imaging  of the abdomen and pelvis was performed using the standard protocol following bolus administration of intravenous contrast. RADIATION DOSE REDUCTION: This exam was performed according to the departmental dose-optimization program which includes automated exposure control, adjustment of the mA and/or kV according to patient size and/or use of iterative reconstruction technique. CONTRAST:  75mL OMNIPAQUE  IOHEXOL  350 MG/ML SOLN COMPARISON:  03/07/2023 FINDINGS: Lower chest: Focal nodular density along the medial left lung base on image 36/5 is stable since 07/07/2020 and most compatible with a focus of scarring. Otherwise, the lung bases are clear. Hepatobiliary: Normal appearance of the liver, gallbladder and portal venous  system. No biliary dilatation. Pancreas: Unremarkable. No pancreatic ductal dilatation or surrounding inflammatory changes. Spleen: Normal in size without focal abnormality. Adrenals/Urinary Tract: Normal adrenal glands. No hydronephrosis. No suspicious renal lesions. Normal urinary bladder. Stomach/Bowel: Limited evaluation of the GE junction but similar to the previous examination. Mild dilatation of the proximal stomach and there is diffuse narrowing in the distal stomach at the antrum and pylorus region. Wall thickness measures up to 1.8 cm in the distal stomach. The wall thickness and narrowing in the distal stomach persists on the delayed images. Normal appearance of the duodenum. No significant inflammatory changes around the stomach. No small bowel dilatation. Scattered colonic diverticula without acute colonic inflammation. Normal appendix. Vascular/Lymphatic: Aortic atherosclerosis. No enlarged abdominal or pelvic lymph nodes. Reproductive: Prostate is unremarkable. Other: No free fluid. No evidence for free air. Small umbilical hernia containing fat. Musculoskeletal: No acute bone abnormality. IMPRESSION: 1. Abnormal appearance of the distal stomach with diffuse wall thickening and  narrowing. Findings are nonspecific. Findings could represent gastritis but a neoplastic process cannot be excluded. Recommend GI consultation. 2. No other acute abnormality in the abdomen or pelvis. 3. Colonic diverticulosis without acute inflammation. 4. Aortic Atherosclerosis (ICD10-I70.0). Electronically Signed   By: Juliene Balder M.D.   On: 01/04/2024 15:45     Procedures   Medications Ordered in the ED  docusate sodium  (COLACE) capsule 100 mg (has no administration in time range)  ondansetron  (ZOFRAN ) tablet 4 mg (has no administration in time range)    Or  ondansetron  (ZOFRAN ) injection 4 mg (has no administration in time range)  thiamine  (VITAMIN B1) tablet 100 mg (has no administration in time range)  folic acid  (FOLVITE ) tablet 1 mg (has no administration in time range)  bisacodyl  (DULCOLAX) EC tablet 5 mg (has no administration in time range)  potassium chloride  10 mEq in 100 mL IVPB (has no administration in time range)  lactated ringers  infusion (has no administration in time range)  pantoprazole  (PROTONIX ) injection 40 mg (has no administration in time range)  fluticasone  (FLONASE ) 50 MCG/ACT nasal spray 1 spray (has no administration in time range)  oxymetazoline  (AFRIN) 0.05 % nasal spray 1 spray (has no administration in time range)  insulin  aspart (novoLOG ) injection 0-6 Units (has no administration in time range)  iohexol  (OMNIPAQUE ) 350 MG/ML injection 75 mL (75 mLs Intravenous Contrast Given 01/04/24 1456)  sodium chloride  0.9 % bolus 1,000 mL (0 mLs Intravenous Stopped 01/04/24 1837)  ondansetron  (ZOFRAN ) injection 4 mg (4 mg Intravenous Given 01/04/24 1611)  fentaNYL  (SUBLIMAZE ) injection 50 mcg (50 mcg Intravenous Given 01/04/24 1611)  pantoprazole  (PROTONIX ) injection 40 mg (40 mg Intravenous Given 01/04/24 1611)                                    Medical Decision Making Risk Prescription drug management. Decision regarding hospitalization.   This patient presents to  the ED for concern of nausea and vomiting, this involves an extensive number of treatment options, and is a complaint that carries with it a high risk of complications and morbidity.  I considered the following differential and admission for this acute, potentially life threatening condition.  The differential diagnosis includes gastritis, gastroparesis, DKA, pancreatitis, cholecystitis, gastroenteritis, bowel perforation  MDM:    Patient is a 58 year old male who presents with nausea and vomiting for the last 2 days.  He has some associated upper abdominal pain.  No fever.  No  change in his stools.  Labs show an elevated WBC count.  Lipase is mildly elevated.  LFTs are normal.  CT scan shows evidence of thickening of the gastric wall.  Discussed the findings with the radiologist who feels that there is severe narrowing consistent with partial obstructive pattern.  Discussed with gastroenterologist on-call, Dr. Suzann, who advises to keep the patient n.p.o. after midnight but for now patient can have clears and they will plan on doing endoscopy tomorrow.  Discussed with Dr. Arthea who will admit the patient for further treatment.    (Labs, imaging, consults)  Labs: I Ordered, and personally interpreted labs.  The pertinent results include: Elevated WBC count, mild elevated creatinine  Imaging Studies ordered: I ordered imaging studies including CT abdomen pelvis I independently visualized and interpreted imaging. I agree with the radiologist interpretation  Additional history obtained from chart review.  External records from outside source obtained and reviewed including prior notes  Cardiac Monitoring: The patient was maintained on a cardiac monitor.  If on the cardiac monitor, I personally viewed and interpreted the cardiac monitored which showed an underlying rhythm of: Sinus rhythm  Reevaluation: After the interventions noted above, I reevaluated the patient and found that they have  :improved  Social Determinants of Health:  uninsured  Disposition: Admit to hospital  Co morbidities that complicate the patient evaluation  Past Medical History:  Diagnosis Date   Allergy    Diabetes mellitus without complication (HCC)    Environmental allergies    Hypertension    Sinus infection      Medicines Meds ordered this encounter  Medications   iohexol  (OMNIPAQUE ) 350 MG/ML injection 75 mL   sodium chloride  0.9 % bolus 1,000 mL   ondansetron  (ZOFRAN ) injection 4 mg   fentaNYL  (SUBLIMAZE ) injection 50 mcg   pantoprazole  (PROTONIX ) injection 40 mg   docusate sodium  (COLACE) capsule 100 mg   OR Linked Order Group    ondansetron  (ZOFRAN ) tablet 4 mg    ondansetron  (ZOFRAN ) injection 4 mg   thiamine  (VITAMIN B1) tablet 100 mg   folic acid  (FOLVITE ) tablet 1 mg   bisacodyl  (DULCOLAX) EC tablet 5 mg   potassium chloride  10 mEq in 100 mL IVPB   lactated ringers  infusion   pantoprazole  (PROTONIX ) injection 40 mg   fluticasone  (FLONASE ) 50 MCG/ACT nasal spray 1 spray   oxymetazoline  (AFRIN) 0.05 % nasal spray 1 spray   insulin  aspart (novoLOG ) injection 0-6 Units    Correction coverage::   Very Sensitive (ESRD/Dialysis)    CBG < 70::   Implement Hypoglycemia Standing Orders and refer to Hypoglycemia Standing Orders sidebar report    CBG 70 - 120::   0 units    CBG 121 - 150::   0 units    CBG 151 - 200::   1 unit    CBG 201-250::   2 units    CBG 251-300::   3 units    CBG 301-350::   4 units    CBG 351-400::   5 units    CBG > 400:   Give 6 units and call MD    I have reviewed the patients home medicines and have made adjustments as needed  Problem List / ED Course: Problem List Items Addressed This Visit   None Visit Diagnoses       Nausea and vomiting, unspecified vomiting type    -  Primary                Final  diagnoses:  Nausea and vomiting, unspecified vomiting type    ED Discharge Orders     None          Lenor Hollering,  MD 01/04/24 2003

## 2024-01-04 NOTE — ED Provider Notes (Signed)
 [facility]    CSN: [id number] Arrival date & time: 01/04/24  1029      History   Chief Complaint Chief Complaint  Patient presents with   Emesis    HPI Jorge [name] is a 58 y.o. male.   Jorge [name] is a 58 y.o. male presenting for chief complaint of left upper quadrant/epigastric abdominal pain that started abruptly last night at approximately 10:30 PM.  He began to feel nauseous and started vomiting at approximately 12 AM to 1 AM early this morning.  He has had multiple episodes of nonbloody/nonbilious emesis in the last 12 hours.  Abdominal pain is currently a 7 on a scale 0-10, localized to the left upper quadrant abdomen and epigastrium, and is colicky/throbbing in nature.  Certain position changes makes pain worse or better.  Reports history of pancreatitis, denies recent alcohol/NSAID use.  Pain does not radiate to the upper back through the abdomen and does not radiate to the left upper chest.  Denies shortness of breath/heart palpitations associated with pain.  No recent sick contacts with similar symptoms.  He denies fever, chills, diarrhea, lower abdominal pain, urinary symptoms, flank pain, gross hematuria, blood/mucus in the stools.  He is a every day smoker, denies other illicit drug use.  He became dizzy and diaphoretic on his drive to urgent care.   History of type 2 diabetes and hypertension.  He lost his job several months ago resulting in cessation of his daily medications for diabetes and high blood pressure.    Emesis   Past Medical History:  Diagnosis Date   Allergy    Diabetes mellitus without complication (HCC)    Environmental allergies    Hypertension    Sinus infection     Patient Active Problem List   Diagnosis Date Noted   Essential hypertension 09/06/2018   Prediabetes 09/06/2018   Non-seasonal allergic rhinitis due to pollen 09/06/2018    Past Surgical History:  Procedure Laterality Date   NO PAST SURGERIES          Home Medications    Prior to Admission medications   Medication Sig Start Date End Date Taking? Authorizing Provider  atorvastatin  (LIPITOR) 40 MG tablet Take 1 tablet (40 mg total) by mouth daily. Patient not taking: Reported on 01/04/2024 09/07/18   Fleming, [name], NP  azelastine  (ASTELIN ) 0.1 % nasal spray Place 2 sprays into both nostrils 2 (two) times daily for 30 days. Use in each nostril as directed Patient not taking: Reported on 01/04/2024 09/06/18 10/06/18  Fleming, [name], NP  baclofen  (LIORESAL ) 10 MG tablet Take 1 tablet (10 mg total) by mouth 2 (two) times daily as needed for muscle spasms. Patient not taking: Reported on 01/04/2024 12/10/22   Raspet, Erin K, PA-C  Blood Glucose Monitoring Suppl (TRUE METRIX GO GLUCOSE METER) w/Device KIT 1 each by Does not apply route every 8 (eight) hours as needed. Patient not taking: Reported on 12/07/2016 10/04/16   Marilyne Morrison T, MD  cetirizine  (ZYRTEC ) 10 MG tablet Take 1 tablet (10 mg total) by mouth daily. Patient not taking: Reported on 01/04/2024 09/06/18   Theotis Haze ORN, NP  glucose blood test strip Use as instructed 09/06/18   Fleming, [name], NP  lisinopril -hydrochlorothiazide  (PRINZIDE ,ZESTORETIC ) 10-12.5 MG tablet Take 1 tablet by mouth daily. Patient not taking: Reported on 01/04/2024 09/06/18   Fleming, [name], NP  metFORMIN  (GLUCOPHAGE ) 500 MG tablet Take 1 tablet (500 mg total) by mouth 2 (two) times daily with  a meal. Patient not taking: Reported on 01/04/2024 09/06/18   Theotis Haze ORN, NP  TRUEplus Lancets 26G MISC 1 each by Does not apply route every 8 (eight) hours as needed. 09/06/18   Theotis Haze ORN, NP    Family History Family History  Problem Relation Age of Onset   Ovarian cancer Mother    Diabetes Father    Colon cancer Neg Hx     Social History Social History   Tobacco Use   Smoking status: Every Day    Current packs/day: 0.50    Types: Cigarettes   Smokeless tobacco: Never  Vaping Use   Vaping  status: Never Used  Substance Use Topics   Alcohol use: Not Currently    Comment: beer 1-2 times per month per pt.   Drug use: Not Currently    Types: Marijuana    Comment: daily per pt last 12/05/16     Allergies   Other and Peanut-containing drug products   Review of Systems Review of Systems  Gastrointestinal:  Positive for vomiting.  Per HPI  Physical Exam Triage Vital Signs ED Triage Vitals  Encounter Vitals Group     BP 01/04/24 1036 (!) 146/118     Girls Systolic BP Percentile --      Girls Diastolic BP Percentile --      Boys Systolic BP Percentile --      Boys Diastolic BP Percentile --      Pulse Rate 01/04/24 1036 (!) 117     Resp 01/04/24 1036 18     Temp 01/04/24 1036 99 F (37.2 C)     Temp Source 01/04/24 1036 Oral     SpO2 01/04/24 1036 96 %     Weight --      Height --      Head Circumference --      Peak Flow --      Pain Score 01/04/24 1037 0     Pain Loc --      Pain Education --      Exclude from Growth Chart --    Orthostatic VS for the past 24 hrs:  BP- Lying Pulse- Lying BP- Sitting Pulse- Sitting  01/04/24 1052 (!) 128/94 111 (!) 134/113 122    Updated Vital Signs BP (!) 161/107 (BP Location: Right Arm)   Pulse 97   Temp 98.7 F (37.1 C) (Oral)   Resp 16   SpO2 97%   Visual Acuity Right Eye Distance:   Left Eye Distance:   Bilateral Distance:    Right Eye Near:   Left Eye Near:    Bilateral Near:     Physical Exam Vitals and nursing note reviewed.  Constitutional:      Appearance: He is ill-appearing and diaphoretic. He is not toxic-appearing.  HENT:     Head: Normocephalic and atraumatic.     Right Ear: Hearing and external ear normal.     Left Ear: Hearing and external ear normal.     Nose: Nose normal.     Mouth/Throat:     Lips: Pink.  Eyes:     General: Lids are normal. Vision grossly intact. Gaze aligned appropriately.     Extraocular Movements: Extraocular movements intact.     Conjunctiva/sclera:  Conjunctivae normal.  Cardiovascular:     Rate and Rhythm: Normal rate and regular rhythm.     Heart sounds: Normal heart sounds, S1 normal and S2 normal.  Pulmonary:     Effort: Pulmonary effort is normal.  No respiratory distress.     Breath sounds: Normal breath sounds and air entry. No wheezing, rhonchi or rales.  Chest:     Chest wall: No tenderness.  Abdominal:     General: Abdomen is flat. Bowel sounds are normal. There is distension.     Palpations: Abdomen is soft.     Tenderness: There is abdominal tenderness. There is no right CVA tenderness, left CVA tenderness, guarding or rebound.     Hernia: No hernia is present.  Musculoskeletal:     Cervical back: Neck supple.  Skin:    General: Skin is warm.     Capillary Refill: Capillary refill takes less than 2 seconds.     Findings: No rash.  Neurological:     General: No focal deficit present.     Mental Status: He is alert and oriented to person, place, and time. Mental status is at baseline.     Cranial Nerves: No dysarthria or facial asymmetry.  Psychiatric:        Mood and Affect: Mood normal.        Speech: Speech normal.        Behavior: Behavior normal.        Thought Content: Thought content normal.        Judgment: Judgment normal.      UC Treatments / Results  Labs (all labs ordered are listed, but only abnormal results are displayed) Labs Reviewed  POCT FASTING CBG [facility] MANUAL ENTRY - Abnormal; Notable for the following components:      Result Value   POCT Glucose ([facility]) 229 (*)    All other components within normal limits    EKG   Radiology No results found.  Procedures Procedures (including critical care time)  Medications Ordered in UC Medications  ondansetron  (ZOFRAN ) injection 4 mg (4 mg Intravenous Given 01/04/24 1104)  morphine  (PF) 2 MG/ML injection 2 mg (2 mg Intravenous Given 01/04/24 1104)  sodium chloride  0.9 % bolus 1,000 mL (0 mLs Intravenous Stopped 01/04/24 1131)  morphine  (PF) 2 MG/ML  injection 2 mg (2 mg Intravenous Given 01/04/24 1137)    Initial Impression / Assessment and Plan / UC Course  I have reviewed the triage vital signs and the nursing notes.  Pertinent labs & imaging results that were available during my care of the patient were reviewed by me and considered in my medical decision making (see chart for details).   1.  Epigastric abdominal pain, left upper quadrant abdominal pain, nausea and vomiting, type 2 diabetes with hyperglycemia, diaphoresis  Patient evaluated in triage upon arrival.  Diaphoretic and ill-appearing on arrival.  EKG shows ST/T wave changes in the inferior leads, reviewed previous EKG showing similar findings without new ST/T wave changes. Heart rate lying on arrival is 111, increases to 122 with sitting position change. He appears dehydrated on exam. Abdominal exam is mostly unremarkable, tender to the epigastrium and left upper quadrant.  No peritoneal signs on exam.  Differential includes acute pancreatitis, ACS, AAA, gastric ulcer, viral gastroenteritis, etc.  Recommend further workup and evaluation in the emergency department.  [facility] called to transport patient to the ER.  18-gauge IV placed to the right forearm by provider. 1 L normal saline bolus given.  4 mg Zofran  IV given with significant improvement in nausea.  2 mg morphine  given initially for pain.  Pain started at a 7-8 out of 10 and improved to a 3 out of 10. Patient placed on 2 L nasal cannula oxygen  for respiratory support while administering morphine . Oxygen saturation remained 94 to 95% on 2 L during morphine  administration.  Additional morphine  2 mg given 15 minutes later due to increasing pain at 5 out of 10. On reassessment, pain improved to 1 out of 10 after second dose of 2 mg morphine .  Patient discharged with [facility] in stable condition to the emergency department.   Final Clinical Impressions(s) / UC Diagnoses   Final diagnoses:  Abdominal pain,  left upper quadrant  Diaphoresis  Abdominal pain, epigastric  Nausea and vomiting, unspecified vomiting type  Type 2 diabetes mellitus with hyperglycemia, without long-term current use of insulin  [facility])   Discharge Instructions   None    ED Prescriptions   None    PDMP not reviewed this encounter.   Enedelia Dorna HERO, [place] 01/04/24 1203

## 2024-01-04 NOTE — ED Notes (Signed)
 Charged RN called report at [facility] ED. Ems dispatched due to [facility] not having a truck available.

## 2024-01-04 NOTE — ED Notes (Signed)
 Received report from previous RN. Pt resting, no distress noted.

## 2024-01-04 NOTE — ED Notes (Signed)
 Pt given something to drink. Denies further needs.

## 2024-01-05 ENCOUNTER — Encounter (HOSPITAL_COMMUNITY): Payer: Self-pay | Admitting: Internal Medicine

## 2024-01-05 ENCOUNTER — Inpatient Hospital Stay (HOSPITAL_COMMUNITY): Payer: Self-pay | Admitting: Anesthesiology

## 2024-01-05 ENCOUNTER — Encounter (HOSPITAL_COMMUNITY): Admission: EM | Disposition: A | Discharge status: Home / Self Care | Payer: Self-pay | Attending: Internal Medicine

## 2024-01-05 DIAGNOSIS — T8189XA Other complications of procedures, not elsewhere classified, initial encounter: Secondary | ICD-10-CM | POA: Diagnosis not present

## 2024-01-05 DIAGNOSIS — E119 Type 2 diabetes mellitus without complications: Secondary | ICD-10-CM

## 2024-01-05 DIAGNOSIS — F17210 Nicotine dependence, cigarettes, uncomplicated: Secondary | ICD-10-CM

## 2024-01-05 DIAGNOSIS — I10 Essential (primary) hypertension: Secondary | ICD-10-CM

## 2024-01-05 DIAGNOSIS — K259 Gastric ulcer, unspecified as acute or chronic, without hemorrhage or perforation: Secondary | ICD-10-CM

## 2024-01-05 DIAGNOSIS — K2100 Gastro-esophageal reflux disease with esophagitis, without bleeding: Secondary | ICD-10-CM | POA: Diagnosis not present

## 2024-01-05 DIAGNOSIS — S032XXA Dislocation of tooth, initial encounter: Secondary | ICD-10-CM | POA: Diagnosis not present

## 2024-01-05 DIAGNOSIS — K2980 Duodenitis without bleeding: Secondary | ICD-10-CM

## 2024-01-05 DIAGNOSIS — R933 Abnormal findings on diagnostic imaging of other parts of digestive tract: Secondary | ICD-10-CM | POA: Diagnosis not present

## 2024-01-05 DIAGNOSIS — R112 Nausea with vomiting, unspecified: Secondary | ICD-10-CM | POA: Diagnosis not present

## 2024-01-05 DIAGNOSIS — R9389 Abnormal findings on diagnostic imaging of other specified body structures: Secondary | ICD-10-CM

## 2024-01-05 DIAGNOSIS — J301 Allergic rhinitis due to pollen: Secondary | ICD-10-CM

## 2024-01-05 DIAGNOSIS — R1010 Upper abdominal pain, unspecified: Secondary | ICD-10-CM | POA: Diagnosis not present

## 2024-01-05 HISTORY — PX: ESOPHAGOGASTRODUODENOSCOPY: SHX5428

## 2024-01-05 LAB — COMPREHENSIVE METABOLIC PANEL WITH GFR
ALT: 25 U/L (ref 0–44)
ALT: 23 U/L (ref 0–44)
AST: 27 U/L (ref 15–41)
AST: 22 U/L (ref 15–41)
Albumin: 3.6 g/dL (ref 3.5–5.0)
Albumin: 3.4 g/dL — ABNORMAL LOW (ref 3.5–5.0)
Alkaline Phosphatase: 55 U/L (ref 38–126)
Alkaline Phosphatase: 50 U/L (ref 38–126)
Anion gap: 11 (ref 5–15)
Anion gap: 9 (ref 5–15)
BUN: 16 mg/dL (ref 6–20)
BUN: 16 mg/dL (ref 6–20)
CO2: 30 mmol/L (ref 22–32)
CO2: 32 mmol/L (ref 22–32)
Calcium: 9.6 mg/dL (ref 8.9–10.3)
Calcium: 9.1 mg/dL (ref 8.9–10.3)
Chloride: 96 mmol/L — ABNORMAL LOW (ref 98–111)
Chloride: 97 mmol/L — ABNORMAL LOW (ref 98–111)
Creatinine, Ser: 1.31 mg/dL — ABNORMAL HIGH (ref 0.61–1.24)
Creatinine, Ser: 1.21 mg/dL (ref 0.61–1.24)
GFR, Estimated: >60 mL/min (ref >60)
GFR, Estimated: >60 mL/min (ref >60)
Glucose, Bld: 123 mg/dL — ABNORMAL HIGH (ref 70–99)
Glucose, Bld: 133 mg/dL — ABNORMAL HIGH (ref 70–99)
Potassium: 3.6 mmol/L (ref 3.5–5.1)
Potassium: 3.5 mmol/L (ref 3.5–5.1)
Sodium: 137 mmol/L (ref 135–145)
Sodium: 138 mmol/L (ref 135–145)
Total Bilirubin: 0.7 mg/dL (ref 0.0–1.2)
Total Bilirubin: 0.9 mg/dL (ref 0.0–1.2)
Total Protein: 7.7 g/dL (ref 6.5–8.1)
Total Protein: 6.8 g/dL (ref 6.5–8.1)

## 2024-01-05 LAB — CBC WITH DIFFERENTIAL/PLATELET
Abs Immature Granulocytes: 0.04 K/uL (ref 0.00–0.07)
Basophils Absolute: 0.1 K/uL (ref 0.0–0.1)
Basophils Relative: 0 %
Eosinophils Absolute: 0.2 K/uL (ref 0.0–0.5)
Eosinophils Relative: 1 %
HCT: 46.1 % (ref 39.0–52.0)
Hemoglobin: 15.6 g/dL (ref 13.0–17.0)
Immature Granulocytes: 0 %
Lymphocytes Relative: 18 %
Lymphs Abs: 2.7 K/uL (ref 0.7–4.0)
MCH: 30 pg (ref 26.0–34.0)
MCHC: 33.8 g/dL (ref 30.0–36.0)
MCV: 88.7 fL (ref 80.0–100.0)
Monocytes Absolute: 1.3 K/uL — ABNORMAL HIGH (ref 0.1–1.0)
Monocytes Relative: 9 %
Neutro Abs: 10.9 K/uL — ABNORMAL HIGH (ref 1.7–7.7)
Neutrophils Relative %: 72 %
Platelets: 226 K/uL (ref 150–400)
RBC: 5.2 MIL/uL (ref 4.22–5.81)
RDW: 14.5 % (ref 11.5–15.5)
WBC: 15.2 K/uL — ABNORMAL HIGH (ref 4.0–10.5)
nRBC: 0 % (ref 0.0–0.2)

## 2024-01-05 LAB — MAGNESIUM
Magnesium: 1.7 mg/dL (ref 1.7–2.4)
Magnesium: 1.7 mg/dL (ref 1.7–2.4)

## 2024-01-05 LAB — PHOSPHORUS: Phosphorus: 2.8 mg/dL (ref 2.5–4.6)

## 2024-01-05 LAB — GLUCOSE, CAPILLARY
Glucose-Capillary: 106 mg/dL — ABNORMAL HIGH (ref 70–99)
Glucose-Capillary: 141 mg/dL — ABNORMAL HIGH (ref 70–99)
Glucose-Capillary: 109 mg/dL — ABNORMAL HIGH (ref 70–99)
Glucose-Capillary: 123 mg/dL — ABNORMAL HIGH (ref 70–99)

## 2024-01-05 LAB — HEMOGLOBIN A1C
Hgb A1c MFr Bld: 6.6 % — ABNORMAL HIGH (ref 4.8–5.6)
Mean Plasma Glucose: 143 mg/dL

## 2024-01-05 LAB — HIV ANTIBODY (ROUTINE TESTING W REFLEX): HIV Screen 4th Generation wRfx: NONREACTIVE

## 2024-01-05 SURGERY — EGD (ESOPHAGOGASTRODUODENOSCOPY)
Anesthesia: General

## 2024-01-05 MED ORDER — LABETALOL HCL 5 MG/ML IV SOLN
INTRAVENOUS | Status: DC | PRN
  Administered 2024-01-05: 10 mg via INTRAVENOUS

## 2024-01-05 MED ORDER — ONDANSETRON HCL 4 MG/2ML IJ SOLN
INTRAMUSCULAR | Status: DC | PRN
  Administered 2024-01-05: 4 mg via INTRAVENOUS

## 2024-01-05 MED ORDER — OXYMETAZOLINE HCL 0.05 % NA SOLN
NASAL | Status: AC
  Filled 2024-01-05: qty 30

## 2024-01-05 MED ORDER — DEXMEDETOMIDINE HCL IN NACL 80 MCG/20ML IV SOLN
INTRAVENOUS | Status: DC | PRN
  Administered 2024-01-05: 8 ug via INTRAVENOUS

## 2024-01-05 MED ORDER — SUCCINYLCHOLINE CHLORIDE 200 MG/10ML IV SOSY
PREFILLED_SYRINGE | INTRAVENOUS | Status: DC | PRN
  Administered 2024-01-05: 180 mg via INTRAVENOUS

## 2024-01-05 MED ORDER — SUGAMMADEX SODIUM 200 MG/2ML IV SOLN
INTRAVENOUS | Status: DC | PRN
  Administered 2024-01-05: 200 mg via INTRAVENOUS

## 2024-01-05 MED ORDER — DEXAMETHASONE SODIUM PHOSPHATE 10 MG/ML IJ SOLN
INTRAMUSCULAR | Status: DC | PRN
  Administered 2024-01-05: 10 mg via INTRAVENOUS

## 2024-01-05 MED ORDER — PHENYLEPHRINE 80 MCG/ML (10ML) SYRINGE FOR IV PUSH (FOR BLOOD PRESSURE SUPPORT)
PREFILLED_SYRINGE | INTRAVENOUS | Status: DC | PRN
  Administered 2024-01-05 (×3): 160 ug via INTRAVENOUS

## 2024-01-05 MED ORDER — LIDOCAINE 2% (20 MG/ML) 5 ML SYRINGE
INTRAMUSCULAR | Status: DC | PRN
  Administered 2024-01-05: 100 mg via INTRAVENOUS

## 2024-01-05 MED ORDER — EPHEDRINE SULFATE-NACL 50-0.9 MG/10ML-% IV SOSY
PREFILLED_SYRINGE | INTRAVENOUS | Status: DC | PRN
  Administered 2024-01-05 (×3): 5 mg via INTRAVENOUS

## 2024-01-05 MED ORDER — PROPOFOL 10 MG/ML IV BOLUS
INTRAVENOUS | Status: DC | PRN
  Administered 2024-01-05: 100 mg via INTRAVENOUS
  Administered 2024-01-05 (×2): 50 mg via INTRAVENOUS

## 2024-01-05 MED ORDER — SODIUM CHLORIDE 0.9 % IV SOLN: INTRAVENOUS | Status: DC

## 2024-01-05 NOTE — Anesthesia Procedure Notes (Signed)
 Procedure Name: Intubation Date/Time: 01/05/2024 2:15 PM  Performed by: Mollie Olivia SAUNDERS, CRNAPre-anesthesia Checklist: Patient identified, Emergency Drugs available, Suction available and Patient being monitored Patient Re-evaluated:Patient Re-evaluated prior to induction Oxygen Delivery Method: Circle system utilized Preoxygenation: Pre-oxygenation with 100% oxygen Induction Type: IV induction, Rapid sequence, Inhalational induction and Cricoid Pressure applied Ventilation: Oral airway inserted - appropriate to patient size, Two handed mask ventilation required and Mask ventilation with difficulty Laryngoscope Size: Glidescope, 3, Mac and 4 Grade View: Grade I Tube type: Oral Tube size: 7.5 mm Number of attempts: 1 Airway Equipment and Method: Stylet, Oral airway and Video-laryngoscopy Placement Confirmation: ETT inserted through vocal cords under direct vision, positive ETCO2 and breath sounds checked- equal and bilateral Secured at: 23 cm Tube secured with: Tape Dental Injury: Dental damage  Difficulty Due To: Difficulty was anticipated, Difficult Airway- due to limited oral opening, Difficult Airway- due to dentition, Difficult Airway- due to immobile epiglottis, Difficult Airway- due to reduced neck mobility and Difficult Airway- due to large tongue Future Recommendations: Recommend- induction with short-acting agent, and alternative techniques readily available Comments: Emergency intubation: Called dr fitzgerald [id number] to come to endo 3; pt desaturating down to 69% and then quickly regained up to 100% --during intubation 1413-1415,  attempt #1 c mac 4 by mmcmichael, crna c DVL x1 , 2 teeth on top believed to be of poor dentition prior to procedure were loosened apparently, bc p 2nd attempt c glidescope go 4 they were lying under the ETT #7.5 used to intubate.   In postop; pt a&o x4; explained the above written events that occurred during intubation and afterwards, and pt exclaimed his  front tooth on the left was already very loose and he didn't realize if the one on the right was loose too but was same appearance as the left one . He is very forgiving and understands that it was an emergency urgent aw need, and unintended, and is accepting of the apology from CRNA (self) Jorge Roszak, crna.

## 2024-01-05 NOTE — Transfer of Care (Signed)
 Immediate Anesthesia Transfer of Care Note  Patient: Jorge [name]  Procedure(s) Performed: EGD (ESOPHAGOGASTRODUODENOSCOPY)  Patient Location: PACU and Endoscopy Unit  Anesthesia Type:General  Level of Consciousness: awake, alert , and oriented  Airway & Oxygen Therapy: Patient Spontanous Breathing  Post-op Assessment: Report given to RN, Post -op Vital signs reviewed and stable, and Patient moving all extremities X 4  Post vital signs: Reviewed and stable  Last Vitals:  Vitals Value Taken Time  BP 124/85 01/05/24 15:10  Temp    Pulse 82 01/05/24 15:15  Resp 14 01/05/24 15:15  SpO2 95 % 01/05/24 15:15  Vitals shown include unfiled device data.  Last Pain:  Vitals:   01/05/24 1330  TempSrc: Tympanic  PainSc: 0-No pain         Complications: No notable events documented.

## 2024-01-05 NOTE — Hospital Course (Addendum)
 The patient is 58 yo obese AAM with a past medical history significant for but not limited to essential hypertension, diabetes mellitus type 2, history of alcohol abuse and alcoholic pancreatitis who presents with a 2-day history of intractable nausea and vomiting.  He reported that he went to a cookout over the Fourth of July weekend and ate some leftover food Monday and awoke in the middle night vomiting 2 nights ago and has had recurrent vomiting since then.  He describes the vomitus as nonbloody and nonbilious.  Started having abdominal discomfort and pain mainly in epigastric area.  Given his symptoms and worsening he presented to the ED and CT scan abdomen pelvis was done and revealed diffuse wall thickening and narrowing in the area of the distal stomach.  He is placed empirically on PPI, given IV fluid hydration as well as pain control and GI was consulted.  GI is now pursuing an EGD.  EGD was done and showed LA grade B reflux esophagitis with no bleeding and nonbleeding gastric ulcers with pigmented material as well as duodenitis.  Biopsies were sent and patient was placed on twice daily PPI.  He he improved and tolerated his meals and diet was aggressive soft and tolerated this without issues.  He is medically stable for discharge at this time will need to follow-up with PCP and gastroenterology in outpatient setting.  Assessment and Plan:  Intractable Nausea and Vomiting, Abdominal Pain with associated abnormal CT w swelling of Distal Stomach: Had Vomiting x 2 days which was Non-bloody and Non-bilious. CT Abd/Pelvis done and showed Nonspecific findings of an abnormal appearance of the distal stomach with diffuse wall thickening and narrowing. This could have represented gastritis but a neoplastic process cannot be excluded. No other findings in the Abd/Pelvis CT were noted except colonic diverticulosis w/o Acute inflammation and Aortic atherosclerosis.  -GI ([place]) consulted and plan is for EGD;  EGD done and showed chronic ulcers and gastritis. -Pain Control and Supportive Care w/ Antiemetics.  -WBC elevated in the setting of Dehydration from N/V and went from 19.3 -> 15.2 and is improved further to 10 point.  -C/w  IV Protonix  empirically and changed to twice daily PPI given EGD findings.    Diabetes Mellitus Type 2: Improved his HbA1c from 6.7 -> 6.3 but that was back in 2020. Check HbA1c in the AM. Getting IVF as below. Hold Metformin  500 mg po BID due to CT imaging. C/w Very Sensitive Novolog  SSI AC. CTM CBG's per Protocol and they are ranging from 109-141   Essential Hypertension: At Home takes Lisinopril -hydrochlorothiazide  10-12.5 mg po Daily. An AIIRB would be more effective and not risk the hypokalemia. CTM BP per protocol. C/w IV Hydralazine  10 mg q4hprn for SBP>170 or DBP>100. Last BP reading was 150/112.   History of alcohol abuse and alcoholic pancreatitis: The patient has been sober for 6 years. Lipase level mildly elevated at 59 and no evidence of Pancreatitis on CT Scan.    Post Nasal Drip left nare only: Has been having this for >1 month. He was given Oxymetazoline  2 Spray each nare Daily  because his high blood pressure precludes decongestants. Now on Fluticasone  Nasal Spray 1 Spray each Nare daily    Erythrocytosis: Looks like he was Hemoconcentrated on Admit due to N/V. Hgb/Hct went from 17.8/53.2 -> 15.6/46.1 and is now 15.4/45.7. CTM and Trend and repeat CBC in the AM   AKI/Renal Insufficiency: Improving. BUN/Cr Trend: Recent Labs  Lab 01/04/24 1221 01/05/24 0515 01/05/24 9093  01/06/24 0605  BUN 22* 16 16 14   CREATININE 1.56* 1.31* 1.21 1.22  -Currently getting IVF Hydration with LR @ 75 mL/hr; Given a Bolus of NS 1 Liter -Avoid Nephrotoxic Medications, Contrast Dyes, Hypotension and Dehydration to Ensure Adequate Renal Perfusion and will need to Renally Adjust Meds -Continue to Monitor and Trend Renal Function carefully and repeat CMP in the AM    Hyperbilirubinemia: Improved. T Bili went from 1.5 -> 0.7 -> 0.9. CTM and Trend and repeat CMP in the AM  Hypoalbuminemia: Albumin Lvl trending down and went from 3.9 -> 3.6 -> 3.4. CTM and Trend and repeat CMP in the AM  Class I Obesity: Complicates overall prognosis and care. Estimated body mass index is 34.33 kg/m as calculated from the following:   Height as of this encounter: 5' 4 (1.626 m).   Weight as of this encounter: 90.7 kg. Weight Loss and Dietary Counseling given

## 2024-01-05 NOTE — Progress Notes (Signed)
 PROGRESS NOTE    Jorge [name]  FMW:[id number] DOB: 1965/10/23 DOA: 01/04/2024 PCP: Theotis Haze ORN, NP   Brief Narrative:  The patient is 58 yo obese AAM with a past medical history significant for but not limited to essential hypertension, diabetes mellitus type 2, history of alcohol abuse and alcoholic pancreatitis who presents with a 2-day history of intractable nausea and vomiting.  He reported that he went to a cookout over the Fourth of July weekend and ate some leftover food Monday and awoke in the middle night vomiting 2 nights ago and has had recurrent vomiting since then.  He describes the vomitus as nonbloody and nonbilious.  Started having abdominal discomfort and pain mainly in epigastric area.  Given his symptoms and worsening he presented to the ED and CT scan abdomen pelvis was done and revealed diffuse wall thickening and narrowing in the area of the distal stomach.  He is placed empirically on PPI, given IV fluid hydration as well as pain control and GI was consulted.  GI is now pursuing an EGD.  Assessment and Plan:  Intractable Nausea and Vomiting, Abdominal Pain with associated abnormal CT w swelling of Distal Stomach: Had Vomiting x 2 days which was Non-bloody and Non-bilious. CT Abd/Pelvis done and showed Nonspecific findings of an abnormal appearance of the distal stomach with diffuse wall thickening and narrowing. This could have represented gastritis but a neoplastic process cannot be excluded. No other findings in the Abd/Pelvis CT were noted except colonic diverticulosis w/o Acute inflammation and Aortic atherosclerosis.  -GI ([place]) consulted and plan is for EGD this AM -Pain Control and Supportive Care w/ Antiemetics.  -WBC elevated in the setting of Dehydration from N/V and went from 19.3 -> 15.2.  -NPO after midnight for EGD in a.m. -C/w  IV Protonix  empirically    Diabetes Mellitus Type 2: Improved his HbA1c from 6.7 -> 6.3 but that was back in 2020. Check  HbA1c in the AM. Getting IVF as below. Hold Metformin  500 mg po BID due to CT imaging. C/w Very Sensitive Novolog  SSI AC. CTM CBG's per Protocol and they are ranging from 109-141   Essential Hypertension: At Home takes Lisinopril -hydrochlorothiazide  10-12.5 mg po Daily. An AIIRB would be more effective and not risk the hypokalemia. CTM BP per protocol. C/w IV Hydralazine  10 mg q4hprn for SBP>170 or DBP>100. Last BP reading was 150/112.   History of alcohol abuse and alcoholic pancreatitis: The patient has been sober for 6 years. Lipase level mildly elevated at 59 and no evidence of Pancreatitis on CT Scan.    Post Nasal Drip left nare only: Has been having this for >1 month. He was given Oxymetazoline  2 Spray each nare Daily  because his high blood pressure precludes decongestants. Now on Fluticasone  Nasal Spray 1 Spray each Nare daily    Erythrocytosis: Looks like he was Hemoconcentrated on Admit due to N/V. Hgb/Hct went from 17.8/53.2 -> 15.6/46.1. CTM and Trend and repeat CBC in the AM   AKI/Renal Insufficiency: Improving. BUN/Cr Trend: Recent Labs  Lab 01/04/24 1221 01/05/24 0515 01/05/24 0906  BUN 22* 16 16  CREATININE 1.56* 1.31* 1.21  -Currently getting IVF Hydration with LR @ 75 mL/hr; Given a Bolus of NS 1 Liter -Avoid Nephrotoxic Medications, Contrast Dyes, Hypotension and Dehydration to Ensure Adequate Renal Perfusion and will need to Renally Adjust Meds -Continue to Monitor and Trend Renal Function carefully and repeat CMP in the AM   Hyperbilirubinemia: Improved. T Bili went from 1.5 -> 0.7 ->  0.9. CTM and Trend and repeat CMP in the AM  Hypoalbuminemia: Albumin Lvl trending down and went from 3.9 -> 3.6 -> 3.4. CTM and Trend and repeat CMP in the AM  Class I Obesity: Complicates overall prognosis and care. Estimated body mass index is 34.33 kg/m as calculated from the following:   Height as of this encounter: 5' 4 (1.626 m).   Weight as of this encounter: 90.7 kg. Weight  Loss and Dietary Counseling given   DVT prophylaxis: SCDs Start: 01/04/24 1833    Code Status: Full Code Family Communication: Discussed with family at bedside  Disposition Plan:  Level of care: Med-Surg Status is: Inpatient Remains inpatient appropriate because: Needs further clinical improvement and clearance by the specialist as well as diet tolerance.  Undergoing EGD   Consultants:  Gastroenterology  Procedures:  EGD  Antimicrobials:  Anti-infectives (From admission, onward)    None       Subjective: Seen and examined at bedside thinks he is doing little bit better today.  Denied any nausea but continues to have some abdominal discomfort.  No other concerns or complaints at this time.  Objective: Vitals:   01/04/24 2329 01/05/24 0404 01/05/24 0750 01/05/24 1330  BP: (!) 164/111 (!) 146/95 (!) 148/96 (!) 150/112  Pulse: 91 100 93 89  Resp: 16 18  15   Temp: 98.6 F (37 C) 98.5 F (36.9 C) 98.4 F (36.9 C) 97.8 F (36.6 C)  TempSrc: Oral Oral Oral Tympanic  SpO2: 97% 94% 96% 96%  Weight:    90.7 kg  Height:    5' 4 (1.626 m)   No intake or output data in the 24 hours ending 01/05/24 1417 Filed Weights   01/04/24 2117 01/05/24 1330  Weight: 91.6 kg 90.7 kg   Examination: Physical Exam:  Constitutional: WN/WD, obese AAM in no acute distress appears calm Respiratory: Mildly diminished to auscultation bilaterally with some coarse breath sounds, no wheezing, rales, rhonchi or crackles. Normal respiratory effort and patient is not tachypenic. No accessory muscle use.  Unlabored breathing Cardiovascular: RRR, no murmurs / rubs / gallops. S1 and S2 auscultated. No extremity edema.  Abdomen: Soft, a little tender to palpate and distended secondary to body habitus. Bowel sounds positive.  GU: Deferred. Musculoskeletal: No clubbing / cyanosis of digits/nails. No joint deformity upper and lower extremities. Skin: No rashes, lesions, ulcers on limited skin evaluation.  No induration; Warm and dry.  Neurologic: CN 2-12 grossly intact with no focal deficits. Romberg sign and cerebellar reflexes not assessed.  Psychiatric: Normal judgment and insight. Alert and oriented x 3. Normal mood and appropriate affect.   Data Reviewed: I have personally reviewed following labs and imaging studies  CBC: Recent Labs  Lab 01/04/24 1221 01/05/24 0906  WBC 19.3* 15.2*  NEUTROABS  --  10.9*  HGB 17.8* 15.6  HCT 53.2* 46.1  MCV 89.1 88.7  PLT 256 226   Basic Metabolic Panel: Recent Labs  Lab 01/04/24 1221 01/05/24 0515 01/05/24 0906  NA 137 137 138  K 3.5 3.6 3.5  CL 91* 96* 97*  CO2 30 30 32  GLUCOSE 163* 123* 133*  BUN 22* 16 16  CREATININE 1.56* 1.31* 1.21  CALCIUM  9.2 9.6 9.1  MG  --  1.7 1.7  PHOS  --   --  2.8   GFR: Estimated Creatinine Clearance: 67.6 mL/min (by C-G formula based on SCr of 1.21 mg/dL). Liver Function Tests: Recent Labs  Lab 01/04/24 1221 01/05/24 0515 01/05/24 9093  AST 33 27 22  ALT 28 25 23   ALKPHOS 54 55 50  BILITOT 1.5* 0.7 0.9  PROT 7.6 7.7 6.8  ALBUMIN 3.9 3.6 3.4*   Recent Labs  Lab 01/04/24 1221  LIPASE 59*   No results for input(s): AMMONIA in the last 168 hours. Coagulation Profile: No results for input(s): INR, PROTIME in the last 168 hours. Cardiac Enzymes: No results for input(s): CKTOTAL, CKMB, CKMBINDEX, TROPONINI in the last 168 hours. BNP (last 3 results) No results for input(s): PROBNP in the last 8760 hours. HbA1C: No results for input(s): HGBA1C in the last 72 hours. CBG: Recent Labs  Lab 01/04/24 2115 01/05/24 0752 01/05/24 1203  GLUCAP 106* 141* 109*   Lipid Profile: No results for input(s): CHOL, HDL, LDLCALC, TRIG, CHOLHDL, LDLDIRECT in the last 72 hours. Thyroid Function Tests: No results for input(s): TSH, T4TOTAL, FREET4, T3FREE, THYROIDAB in the last 72 hours. Anemia Panel: No results for input(s): VITAMINB12, FOLATE,  FERRITIN, TIBC, IRON, RETICCTPCT in the last 72 hours. Sepsis Labs: No results for input(s): PROCALCITON, LATICACIDVEN in the last 168 hours.  No results found for this or any previous visit (from the past 240 hours).   Radiology Studies: CT ABDOMEN PELVIS W CONTRAST Result Date: 01/04/2024 CLINICAL DATA:  Abdominal pain, acute, nonlocalized. Abdominal pain and vomiting for 3 days. EXAM: CT ABDOMEN AND PELVIS WITH CONTRAST TECHNIQUE: Multidetector CT imaging of the abdomen and pelvis was performed using the standard protocol following bolus administration of intravenous contrast. RADIATION DOSE REDUCTION: This exam was performed according to the departmental dose-optimization program which includes automated exposure control, adjustment of the mA and/or kV according to patient size and/or use of iterative reconstruction technique. CONTRAST:  75mL OMNIPAQUE  IOHEXOL  350 MG/ML SOLN COMPARISON:  03/07/2023 FINDINGS: Lower chest: Focal nodular density along the medial left lung base on image 36/5 is stable since 07/07/2020 and most compatible with a focus of scarring. Otherwise, the lung bases are clear. Hepatobiliary: Normal appearance of the liver, gallbladder and portal venous system. No biliary dilatation. Pancreas: Unremarkable. No pancreatic ductal dilatation or surrounding inflammatory changes. Spleen: Normal in size without focal abnormality. Adrenals/Urinary Tract: Normal adrenal glands. No hydronephrosis. No suspicious renal lesions. Normal urinary bladder. Stomach/Bowel: Limited evaluation of the GE junction but similar to the previous examination. Mild dilatation of the proximal stomach and there is diffuse narrowing in the distal stomach at the antrum and pylorus region. Wall thickness measures up to 1.8 cm in the distal stomach. The wall thickness and narrowing in the distal stomach persists on the delayed images. Normal appearance of the duodenum. No significant inflammatory changes  around the stomach. No small bowel dilatation. Scattered colonic diverticula without acute colonic inflammation. Normal appendix. Vascular/Lymphatic: Aortic atherosclerosis. No enlarged abdominal or pelvic lymph nodes. Reproductive: Prostate is unremarkable. Other: No free fluid. No evidence for free air. Small umbilical hernia containing fat. Musculoskeletal: No acute bone abnormality. IMPRESSION: 1. Abnormal appearance of the distal stomach with diffuse wall thickening and narrowing. Findings are nonspecific. Findings could represent gastritis but a neoplastic process cannot be excluded. Recommend GI consultation. 2. No other acute abnormality in the abdomen or pelvis. 3. Colonic diverticulosis without acute inflammation. 4. Aortic Atherosclerosis (ICD10-I70.0). Electronically Signed   By: Juliene Balder M.D.   On: 01/04/2024 15:45   Scheduled Meds:  [MAR Hold] docusate sodium   100 mg Oral BID   [MAR Hold] fluticasone   1 spray Each Nare Daily   [MAR Hold] folic acid   1 mg Oral Daily   [  MAR Hold] insulin  aspart  0-6 Units Subcutaneous TID WC   [MAR Hold] oxymetazoline   1 spray Left Nare BID   [MAR Hold] pantoprazole  (PROTONIX ) IV  40 mg Intravenous Q24H   [MAR Hold] thiamine   100 mg Oral Daily   Continuous Infusions:  sodium chloride      lactated ringers  150 mL/hr at 01/04/24 2005    LOS: 1 day   Alejandro Marker, DO [facility] Available via Epic secure chat 7am-7pm After these hours, please refer to coverage provider listed on [email or web address] 01/05/2024, 2:17 PM

## 2024-01-05 NOTE — H&P (View-Only) (Signed)
 Consultation Note   Referring Provider:  [facility] PCP: Theotis Haze ORN, NP Primary Gastroenterologist:  Gordy Starch, MD Reason for Consultation: Nausea, vomiting and abnormal stomach on CT scan DOA: 01/04/2024         Hospital Day: 2   ASSESSMENT    58 year old male with a history of hypertension, prediabetes versus diabetes, alcohol abuse (in remission), alcoholic pancreatitis.  Admitted yesterday with nausea, vomiting and abdominal pain.  CT scan showing abnormalities of the stomach   Nausea, vomiting, mid upper abdominal pain , abnormal stomach on CT scan.  CTAP with contras t- . Abnormal appearance of the distal stomach with diffuse wall thickening and narrowing.  Of note, CT scan with contrast September 2024 showed a normal-appearing stomach  Mildly elevated lipase (59) He has a history of pancreatitis but no evidence for pancreatitis on CT scan.  Elevated lipase possibly secondary to vomiting  Leukocytosis WBC 19.3 Dehydration possibly contributing as his hemoglobin is also elevated 17.8 /crit 53%  History of alcohol abuse with associated alcoholic pancreatitis In remission.  No alcohol in 5 years  AKI Likely secondary to volume depletion  Hypertension.  BP has been quite elevated this admission.  Has  IV hydralazine  ordered.  BP this a.m. 148/96  See PMH for additional history  Principal Problem:   Intractable nausea and vomiting Active Problems:   Essential hypertension   Prediabetes   Non-seasonal allergic rhinitis due to pollen      PLAN:   --Continue daily pantoprazole  -- Patient needs an EGD which can possibly be done today.  The risks and benefits of EGD with possible biopsies were discussed with the patient who agrees to proceed.  -- Depending on findings at time of endoscopy we may need to increase PPI dose   HPI   Jorge [name] was in his usual state of health until Monday around 2 AM when he  woke up with nausea and vomiting with associated mid upper abdominal pain.  Symptoms continued all day Monday and Tuesday.  Last episode was Wednesday at 5 AM.  Initially he vomited hamburger that he had eaten at the prior meal.  He does not recall the hamburger tasting bad but then has not been able to taste due to sinus congestion.  After vomiting the hamburger, emesis became watery.  No blood in emesis.  No known contact with others with similar symptoms.  He has some sweats but not clear if he was ever febrile.  He had no associated bowel changes with the vomiting.  No dark stools.  Over the last month or so he has been taking some ibuprofen /Motrin  but no more than 5 or 6 doses.  No other NSAIDs.  He has not had alcohol in 5 years  On admission his WBC was elevated at 19.3, hemoglobin also elevated at 17.8 with 53% crit.  He had AKI with creatinine of 1.  5 6 which has improved today to 1.31.  On admission his lipase was 59, total bilirubin 1.5, remainder of LFTs normal.   CT AP with contrast shows mild dilation of the proximal stomach and there is diffuse narrowing in the distal stomach at the antrum and pylorus region.    Labs and Imaging:  Recent  Labs    01/04/24 1221 01/05/24 0515  PROT 7.6 7.7  ALBUMIN 3.9 3.6  AST 33 27  ALT 28 25  ALKPHOS 54 55  BILITOT 1.5* 0.7   Recent Labs    01/04/24 1221  WBC 19.3*  HGB 17.8*  HCT 53.2*  MCV 89.1  PLT 256   Recent Labs    01/04/24 1221 01/05/24 0515  NA 137 137  K 3.5 3.6  CL 91* 96*  CO2 30 30  GLUCOSE 163* 123*  BUN 22* 16  CREATININE 1.56* 1.31*  CALCIUM  9.2 9.6     CT ABDOMEN PELVIS W CONTRAST CLINICAL DATA:  Abdominal pain, acute, nonlocalized. Abdominal pain and vomiting for 3 days.  EXAM: CT ABDOMEN AND PELVIS WITH CONTRAST  TECHNIQUE: Multidetector CT imaging of the abdomen and pelvis was performed using the standard protocol following bolus administration of intravenous contrast.  RADIATION DOSE  REDUCTION: This exam was performed according to the departmental dose-optimization program which includes automated exposure control, adjustment of the mA and/or kV according to patient size and/or use of iterative reconstruction technique.  CONTRAST:  75mL OMNIPAQUE  IOHEXOL  350 MG/ML SOLN  COMPARISON:  03/07/2023  FINDINGS: Lower chest: Focal nodular density along the medial left lung base on image 36/5 is stable since 07/07/2020 and most compatible with a focus of scarring. Otherwise, the lung bases are clear.  Hepatobiliary: Normal appearance of the liver, gallbladder and portal venous system. No biliary dilatation.  Pancreas: Unremarkable. No pancreatic ductal dilatation or surrounding inflammatory changes.  Spleen: Normal in size without focal abnormality.  Adrenals/Urinary Tract: Normal adrenal glands. No hydronephrosis. No suspicious renal lesions. Normal urinary bladder.  Stomach/Bowel: Limited evaluation of the GE junction but similar to the previous examination. Mild dilatation of the proximal stomach and there is diffuse narrowing in the distal stomach at the antrum and pylorus region. Wall thickness measures up to 1.8 cm in the distal stomach. The wall thickness and narrowing in the distal stomach persists on the delayed images. Normal appearance of the duodenum. No significant inflammatory changes around the stomach. No small bowel dilatation. Scattered colonic diverticula without acute colonic inflammation. Normal appendix.  Vascular/Lymphatic: Aortic atherosclerosis. No enlarged abdominal or pelvic lymph nodes.  Reproductive: Prostate is unremarkable.  Other: No free fluid. No evidence for free air. Small umbilical hernia containing fat.  Musculoskeletal: No acute bone abnormality.  IMPRESSION: 1. Abnormal appearance of the distal stomach with diffuse wall thickening and narrowing. Findings are nonspecific. Findings could represent gastritis but a  neoplastic process cannot be excluded. Recommend GI consultation. 2. No other acute abnormality in the abdomen or pelvis. 3. Colonic diverticulosis without acute inflammation. 4. Aortic Atherosclerosis (ICD10-I70.0).  Electronically Signed   By: Juliene Balder M.D.   On: 01/04/2024 15:45    Pertinent GI Studies   Screening colonoscopy June 2018 - Two 3 to 4 mm polyps in the rectum, removed with a cold snare. Resected and retrieved. - Moderate diverticulosis in the sigmoid colon and in the descending colon. - Internal hemorrhoids.  Surgical [P], rectum, polyp (2) - HYPERPLASTIC POLYP. - PROLAPSE TYPE POLYP. - NO DYSPLASIA OR MALIGNANCY  Past Medical History:  Diagnosis Date   Allergy    Diabetes mellitus without complication (HCC)    Environmental allergies    Hypertension    Sinus infection     Past Surgical History:  Procedure Laterality Date   NO PAST SURGERIES      Family History  Problem Relation Age of Onset  Ovarian cancer Mother    Diabetes Father    Colon cancer Neg Hx     Prior to Admission medications   Medication Sig Start Date End Date Taking? Authorizing Provider  acetaminophen  (TYLENOL ) 500 MG tablet Take 500 mg by mouth every 6 (six) hours as needed for moderate pain (pain score 4-6) or fever.   Yes [provider]  Chlorpheniramine Maleate (ALLERGY RELIEF PO) Take 1 tablet by mouth daily as needed (allergies).   Yes [provider]  fluticasone  (FLONASE ) 50 MCG/ACT nasal spray Place 1 spray into both nostrils daily.   Yes [provider]  atorvastatin  (LIPITOR) 40 MG tablet Take 1 tablet (40 mg total) by mouth daily. Patient not taking: Reported on 01/04/2024 09/07/18   Fleming, [name], NP  azelastine  (ASTELIN ) 0.1 % nasal spray Place 2 sprays into both nostrils 2 (two) times daily for 30 days. Use in each nostril as directed Patient not taking: Reported on 01/04/2024 09/06/18 10/06/18  Fleming, [name], NP  baclofen  (LIORESAL ) 10  MG tablet Take 1 tablet (10 mg total) by mouth 2 (two) times daily as needed for muscle spasms. Patient not taking: Reported on 01/04/2024 12/10/22   Raspet, Erin K, PA-C  lisinopril -hydrochlorothiazide  (PRINZIDE ,ZESTORETIC ) 10-12.5 MG tablet Take 1 tablet by mouth daily. Patient not taking: Reported on 01/04/2024 09/06/18   Fleming, [name], NP  metFORMIN  (GLUCOPHAGE ) 500 MG tablet Take 1 tablet (500 mg total) by mouth 2 (two) times daily with a meal. Patient not taking: Reported on 01/04/2024 09/06/18   Fleming, [name], NP    Current Facility-Administered Medications  Medication Dose Route Frequency Provider Last Rate Last Admin   docusate sodium  (COLACE) capsule 100 mg  100 mg Oral BID Claiborne, Claudia, MD       fluticasone  (FLONASE ) 50 MCG/ACT nasal spray 1 spray  1 spray Each Nare Daily Arthea Child, MD       folic acid  (FOLVITE ) tablet 1 mg  1 mg Oral Daily Claiborne, Claudia, MD   1 mg at 01/04/24 2002   hydrALAZINE  (APRESOLINE ) injection 10 mg  10 mg Intravenous Q4H PRN Howerter, Justin B, DO   10 mg at 01/05/24 0004   HYDROmorphone  (DILAUDID ) injection 0.5 mg  0.5 mg Intravenous Q2H PRN Howerter, Justin B, DO   0.5 mg at 01/05/24 0757   insulin  aspart (novoLOG ) injection 0-6 Units  0-6 Units Subcutaneous TID WC Arthea Child, MD       lactated ringers  infusion   Intravenous Continuous Claiborne, Claudia, MD 150 mL/hr at 01/04/24 2005 New Bag at 01/04/24 2005   naloxone  (NARCAN ) injection 0.4 mg  0.4 mg Intravenous PRN Howerter, Justin B, DO       ondansetron  (ZOFRAN ) tablet 4 mg  4 mg Oral Q6H PRN Arthea Child, MD       Or   ondansetron  (ZOFRAN ) injection 4 mg  4 mg Intravenous Q6H PRN Arthea Child, MD       oxymetazoline  (AFRIN) 0.05 % nasal spray 1 spray  1 spray Left Nare BID Claiborne, Claudia, MD   1 spray at 01/05/24 0024   pantoprazole  (PROTONIX ) injection 40 mg  40 mg Intravenous Q24H Claiborne, Claudia, MD   40 mg at 01/05/24 9177   thiamine  (VITAMIN B1)  tablet 100 mg  100 mg Oral Daily Claiborne, Claudia, MD   100 mg at 01/04/24 2002    Allergies as of 01/04/2024 - Review Complete 01/04/2024  Allergen Reaction Noted   Other Anaphylaxis 11/24/2016   Peanut-containing  drug products Anaphylaxis 11/24/2016    Social History   Socioeconomic History   Marital status: Single    Spouse name: Not on file   Number of children: Not on file   Years of education: Not on file   Highest education level: Not on file  Occupational History   Not on file  Tobacco Use   Smoking status: Every Day    Current packs/day: 0.50    Types: Cigarettes   Smokeless tobacco: Never  Vaping Use   Vaping status: Never Used  Substance and Sexual Activity   Alcohol use: Not Currently    Comment: beer 1-2 times per month per pt.   Drug use: Not Currently    Types: Marijuana    Comment: daily per pt last 12/05/16   Sexual activity: Yes  Other Topics Concern   Not on file  Social History Narrative   Not on file   Social Drivers of Health   Financial Resource Strain: Not on file  Food Insecurity: No Food Insecurity (01/05/2024)   Hunger Vital Sign    Worried About Running Out of Food in the Last Year: Never true    Ran Out of Food in the Last Year: Never true  Transportation Needs: No Transportation Needs (01/05/2024)   PRAPARE - Administrator, Civil Service (Medical): No    Lack of Transportation (Non-Medical): No  Physical Activity: Not on file  Stress: Not on file  Social Connections: Not on file  Intimate Partner Violence: Not At Risk (01/05/2024)   Humiliation, Afraid, Rape, and Kick questionnaire    Fear of Current or Ex-Partner: No    Emotionally Abused: No    Physically Abused: No    Sexually Abused: No     Code Status   Code Status: Full Code  Review of Systems: All systems reviewed and negative except where noted in HPI.  Physical Exam: Vital signs in last 24 hours: Temp:  [98.4 F (36.9 C)-99 F (37.2 C)] 98.4 F  (36.9 C) (07/10 0750) Pulse Rate:  [72-117] 93 (07/10 0750) Resp:  [14-18] 18 (07/10 0404) BP: (130-191)/(88-118) 148/96 (07/10 0750) SpO2:  [94 %-99 %] 96 % (07/10 0750) Weight:  [91.6 kg] 91.6 kg (07/09 2117) Last BM Date : 01/04/24  General:  Pleasant male in NAD Psych:  Cooperative. Normal mood and affect Eyes: Pupils equal Ears:  Normal auditory acuity Nose: No deformity, discharge or lesions Neck:  Supple, no masses felt Lungs:  Clear to auscultation.  Heart:  Regular rate, regular rhythm.  Abdomen:  Soft, nondistended, nontender, active bowel sounds, no masses felt Rectal :  Deferred Msk: Symmetrical without gross deformities.  Neurologic:  Alert, oriented, grossly normal neurologically Extremities : No edema Skin:  Intact without significant lesions.    Intake/Output from previous day: No intake/output data recorded. Intake/Output this shift:  No intake/output data recorded.   Vina Dasen, NP-C   01/05/2024, 8:37 AM  I have taken an interval history, thoroughly reviewed the chart and examined the patient. I agree with the Advanced Practitioner's note, impression and recommendations, and have recorded additional findings, impressions and recommendations below. I performed a substantive portion of this encounter (>50% time spent), including a complete performance of the medical decision making.  My additional thoughts are as follows:  58 year old man without chronic upper digestive symptoms became suddenly ill 2 to 3 days ago with upper abdominal pain and protracted vomiting.  CT scan shows abnormality distal stomach with wall thickening and perhaps  dilatation of the more proximal stomach.  Modest NSAID use. No hematemesis, hemoconcentrated with high hemoglobin.  Lack of chronic symptoms speaks against this being neoplasia or likely worrisome finding.  Probably foodborne illness/food poisoning, possibly newly acquired H. pylori.  He looks well without apparent  systemic infectious illness.  Upper endoscopy recommended today and he was agreeable after discussion procedure and risks.  The benefits and risks of the planned procedure(s) were described in detail with the patient or (when appropriate) their health care proxy.  Risks were outlined as including, but not limited to, bleeding, infection, perforation, adverse medication reaction leading to cardiac or pulmonary decompensation, pancreatitis (if ERCP).  The limitation of incomplete mucosal visualization was also discussed.  No guarantees or warranties were given.  If benign findings on exam, probably start full liquid diet today and advance as tolerated to regular diet and hope you may be feeling well enough to go home tomorrow with as needed antiemetics. Victory LITTIE Brand III Office:[phone]

## 2024-01-05 NOTE — TOC CM/SW Note (Signed)
 Transition of Care [facility]) - Inpatient Brief Assessment   Patient Details  Name: Jorge [name] MRN: [id number] Date of Birth: [birth year]  [facility]) CM/SW Contact:    Lauraine FORBES Saa, LCSW Phone Number: 01/05/2024, 8:56 AM   Clinical Narrative:  8:56 AM Per chart review, patient resides at home. Patient has a PCP but does not have insurance. CSW consulted financial counseling for further assistance. Patient does not have SNF/HH/DME history. Patient's preferred pharmacy's are [facility], [facility] [phone] [place], and [facility]. No TOC needs were identified at this time. TOC will continue to follow and be available to assist.  Transition of Care Asessment: Insurance and Status: Selfpay Patient has primary care physician: Yes Home environment has been reviewed: Private Residence Prior level of function:: N/A Prior/Current Home Services: No current home services Social Drivers of Health Review: SDOH reviewed no interventions necessary Readmission risk has been reviewed: Yes Transition of care needs: no transition of care needs at this time

## 2024-01-05 NOTE — Plan of Care (Signed)

## 2024-01-05 NOTE — Interval H&P Note (Signed)
 History and Physical Interval Note:  01/05/2024 1:47 PM  Jorge [name]  has presented today for surgery, with the diagnosis of Nausea, vomiting, abnormal stomach on CT scan.  The various methods of treatment have been discussed with the patient and family. After consideration of risks, benefits and other options for treatment, the patient has consented to  Procedure(s): EGD (ESOPHAGOGASTRODUODENOSCOPY) (N/A) as a surgical intervention.  The patient's history has been reviewed, patient examined, no change in status, stable for surgery.  I have reviewed the patient's chart and labs.  Questions were answered to the patient's satisfaction.     Victory LITTIE Brand III

## 2024-01-05 NOTE — Op Note (Addendum)
 [facility] Patient Name: Jorge [name] Procedure Date : 01/05/2024 MRN: [id number] Attending MD: Victory CROME. Legrand , MD, [id number] Date of Birth: 10/16/1965 CSN: [id number] Age: 58 Admit Type: Inpatient Procedure:                Upper GI endoscopy Indications:              Abnormal CT of the GI tract, Nausea with vomiting Providers:                Victory L. Legrand, MD, Randall Lines, RN, Coye Bade, Technician Referring MD:             [facility] Medicines:                Monitored Anesthesia Care-converted to GETA Complications:            See above Estimated Blood Loss:     Estimated blood loss was minimal. Procedure:                Pre-Anesthesia Assessment:                           - Prior to the procedure, a History and Physical                            was performed, and patient medications and                            allergies were reviewed. The patient's tolerance of                            previous anesthesia was also reviewed. The risks                            and benefits of the procedure and the sedation                            options and risks were discussed with the patient.                            All questions were answered, and informed consent                            was obtained. Prior Anticoagulants: The patient has                            taken no anticoagulant or antiplatelet agents. ASA                            Grade Assessment: III - A patient with severe                            systemic disease. After reviewing the risks and  benefits, the patient was deemed in satisfactory                            condition to undergo the procedure.                           After obtaining informed consent, the endoscope was                            passed under direct vision. Throughout the                            procedure, the patient's blood pressure, pulse,  and                            oxygen saturations were monitored continuously. The                            [id number]-[id number] ([id number]) Olympus endoscope was introduced                            through the mouth, and advanced to the second part                            of duodenum. The upper GI endoscopy was                            accomplished without difficulty. The patient                            tolerated the procedure. Scope In: Scope Out: Findings:      LA Grade B (one or more mucosal breaks greater than 5 mm, not extending       between the tops of two mucosal folds) esophagitis with no bleeding was       found in the distal esophagus.      Five non-bleeding cratered gastric ulcers with densely adherent       pigmented material were found in the gastric antrum (one of them was       clean-based). The largest lesion was approximately 15 mm in largest       dimension. Several biopsies were obtained in the gastric body and in the       gastric antrum with cold forceps for histology (rule out H. pylori).      The exam of the stomach was otherwise normal, including on retroflexion.       Stomach distended well with insufflation.      Diffuse moderate inflammation characterized by congestion (edema) and       erythema was found in the duodenal bulb, in the first portion of the       duodenum and in the second portion of the duodenum.      The patient was difficult to adequately sedate with propofol , he then       became apneic demonstrating signs of upper airway obstruction, and the       anesthesia service made the decision to convert to GETA. The scope had  just reached the gastric antrum and a photograph of the ulcers were       taken, but then the scope was immediately removed. The patient was       intubated, and in the process his top 2 front teeth were dislodged and       landed in the vallecula.      Anesthesia service attempted to remove them using glide scope        visualization and McGill forceps but were unsuccessful.      A combination procedure was then performed with the anesthesiologist       (Dr. Epifanio) holding the GlideScope in position, and then I passed       the endoscope cautiously to this area and retrieved the teeth with a 4       prong grasper. (the 2 teeth were placed in a specimen cup) Impression:               - LA Grade B reflux esophagitis with no bleeding.                           - Non-bleeding gastric ulcers with pigmented                            material.                           - Duodenitis.                           - Several biopsies were obtained in the gastric                            body and in the gastric antrum.                           These ulcers are clearly chronic and do not account                            for his acute nausea and vomiting, which is most                            likely from an acute foodborne illness based on the                            overall clinical picture. However, these ulcers and                            the adjacent inflammation do explain the findings                            on CT scan. Recommendation:           - Return patient to hospital ward for ongoing care.                           - Soft diet.                           -  Continue pantoprazole  40 mg by mouth twice daily                            before breakfast and supper                           Follow-up biopsies                           Discontinue use of all NSAIDs (and aspirin, if                            taking it) Procedure Code(s):        --- Professional ---                           [phone], Esophagogastroduodenoscopy, flexible,                            transoral; with removal of foreign body(s)                           43239, Esophagogastroduodenoscopy, flexible,                            transoral; with biopsy, single or multiple Diagnosis Code(s):        --- Professional ---                            K21.00, Gastro-esophageal reflux disease with                            esophagitis, without bleeding                           K25.9, Gastric ulcer, unspecified as acute or                            chronic, without hemorrhage or perforation                           K29.80, Duodenitis without bleeding                           R11.2, Nausea with vomiting, unspecified                           R93.3, Abnormal findings on diagnostic imaging of                            other parts of digestive tract CPT copyright 2022 American Medical Association. All rights reserved. The codes documented in this report are preliminary and upon coder review may  be revised to meet current compliance requirements. [place] Thielman L. Legrand, MD 01/05/2024 3:10:07 PM This report has been signed electronically. Number of Addenda: 0

## 2024-01-05 NOTE — Anesthesia Postprocedure Evaluation (Signed)
 Anesthesia Post Note  Patient: Jorge [name]  Procedure(s) Performed: EGD (ESOPHAGOGASTRODUODENOSCOPY)     Patient location during evaluation: PACU Anesthesia Type: General Level of consciousness: awake and alert Pain management: pain level controlled Vital Signs Assessment: post-procedure vital signs reviewed and stable Respiratory status: spontaneous breathing, nonlabored ventilation and respiratory function stable Cardiovascular status: blood pressure returned to baseline and stable Postop Assessment: no apparent nausea or vomiting Anesthetic complications: yes (Teeth knocked out on intubation.)   No notable events documented.  Last Vitals:  Vitals:   01/05/24 1520 01/05/24 1530  BP: 122/89 (!) 120/93  Pulse: 83 79  Resp: 18 17  Temp:    SpO2: 92% 95%    Last Pain:  Vitals:   01/05/24 1530  TempSrc:   PainSc: 0-No pain                 Kolston Lacount,W. EDMOND

## 2024-01-05 NOTE — Consult Note (Addendum)
 Consultation Note   Referring Provider:  [facility] PCP: Theotis Haze ORN, NP Primary Gastroenterologist:  Gordy Starch, MD Reason for Consultation: Nausea, vomiting and abnormal stomach on CT scan DOA: 01/04/2024         Hospital Day: 2   ASSESSMENT    58 year old male with a history of hypertension, prediabetes versus diabetes, alcohol abuse (in remission), alcoholic pancreatitis.  Admitted yesterday with nausea, vomiting and abdominal pain.  CT scan showing abnormalities of the stomach   Nausea, vomiting, mid upper abdominal pain , abnormal stomach on CT scan.  CTAP with contras t- . Abnormal appearance of the distal stomach with diffuse wall thickening and narrowing.  Of note, CT scan with contrast September 2024 showed a normal-appearing stomach  Mildly elevated lipase (59) He has a history of pancreatitis but no evidence for pancreatitis on CT scan.  Elevated lipase possibly secondary to vomiting  Leukocytosis WBC 19.3 Dehydration possibly contributing as his hemoglobin is also elevated 17.8 /crit 53%  History of alcohol abuse with associated alcoholic pancreatitis In remission.  No alcohol in 5 years  AKI Likely secondary to volume depletion  Hypertension.  BP has been quite elevated this admission.  Has  IV hydralazine  ordered.  BP this a.m. 148/96  See PMH for additional history  Principal Problem:   Intractable nausea and vomiting Active Problems:   Essential hypertension   Prediabetes   Non-seasonal allergic rhinitis due to pollen      PLAN:   --Continue daily pantoprazole  -- Patient needs an EGD which can possibly be done today.  The risks and benefits of EGD with possible biopsies were discussed with the patient who agrees to proceed.  -- Depending on findings at time of endoscopy we may need to increase PPI dose   HPI   Keltin was in his usual state of health until Monday around 2 AM when he  woke up with nausea and vomiting with associated mid upper abdominal pain.  Symptoms continued all day Monday and Tuesday.  Last episode was Wednesday at 5 AM.  Initially he vomited hamburger that he had eaten at the prior meal.  He does not recall the hamburger tasting bad but then has not been able to taste due to sinus congestion.  After vomiting the hamburger, emesis became watery.  No blood in emesis.  No known contact with others with similar symptoms.  He has some sweats but not clear if he was ever febrile.  He had no associated bowel changes with the vomiting.  No dark stools.  Over the last month or so he has been taking some ibuprofen /Motrin  but no more than 5 or 6 doses.  No other NSAIDs.  He has not had alcohol in 5 years  On admission his WBC was elevated at 19.3, hemoglobin also elevated at 17.8 with 53% crit.  He had AKI with creatinine of 1.  5 6 which has improved today to 1.31.  On admission his lipase was 59, total bilirubin 1.5, remainder of LFTs normal.   CT AP with contrast shows mild dilation of the proximal stomach and there is diffuse narrowing in the distal stomach at the antrum and pylorus region.    Labs and Imaging:  Recent  Labs    01/04/24 1221 01/05/24 0515  PROT 7.6 7.7  ALBUMIN 3.9 3.6  AST 33 27  ALT 28 25  ALKPHOS 54 55  BILITOT 1.5* 0.7   Recent Labs    01/04/24 1221  WBC 19.3*  HGB 17.8*  HCT 53.2*  MCV 89.1  PLT 256   Recent Labs    01/04/24 1221 01/05/24 0515  NA 137 137  K 3.5 3.6  CL 91* 96*  CO2 30 30  GLUCOSE 163* 123*  BUN 22* 16  CREATININE 1.56* 1.31*  CALCIUM  9.2 9.6     CT ABDOMEN PELVIS W CONTRAST CLINICAL DATA:  Abdominal pain, acute, nonlocalized. Abdominal pain and vomiting for 3 days.  EXAM: CT ABDOMEN AND PELVIS WITH CONTRAST  TECHNIQUE: Multidetector CT imaging of the abdomen and pelvis was performed using the standard protocol following bolus administration of intravenous contrast.  RADIATION DOSE  REDUCTION: This exam was performed according to the departmental dose-optimization program which includes automated exposure control, adjustment of the mA and/or kV according to patient size and/or use of iterative reconstruction technique.  CONTRAST:  75mL OMNIPAQUE  IOHEXOL  350 MG/ML SOLN  COMPARISON:  03/07/2023  FINDINGS: Lower chest: Focal nodular density along the medial left lung base on image 36/5 is stable since 07/07/2020 and most compatible with a focus of scarring. Otherwise, the lung bases are clear.  Hepatobiliary: Normal appearance of the liver, gallbladder and portal venous system. No biliary dilatation.  Pancreas: Unremarkable. No pancreatic ductal dilatation or surrounding inflammatory changes.  Spleen: Normal in size without focal abnormality.  Adrenals/Urinary Tract: Normal adrenal glands. No hydronephrosis. No suspicious renal lesions. Normal urinary bladder.  Stomach/Bowel: Limited evaluation of the GE junction but similar to the previous examination. Mild dilatation of the proximal stomach and there is diffuse narrowing in the distal stomach at the antrum and pylorus region. Wall thickness measures up to 1.8 cm in the distal stomach. The wall thickness and narrowing in the distal stomach persists on the delayed images. Normal appearance of the duodenum. No significant inflammatory changes around the stomach. No small bowel dilatation. Scattered colonic diverticula without acute colonic inflammation. Normal appendix.  Vascular/Lymphatic: Aortic atherosclerosis. No enlarged abdominal or pelvic lymph nodes.  Reproductive: Prostate is unremarkable.  Other: No free fluid. No evidence for free air. Small umbilical hernia containing fat.  Musculoskeletal: No acute bone abnormality.  IMPRESSION: 1. Abnormal appearance of the distal stomach with diffuse wall thickening and narrowing. Findings are nonspecific. Findings could represent gastritis but a  neoplastic process cannot be excluded. Recommend GI consultation. 2. No other acute abnormality in the abdomen or pelvis. 3. Colonic diverticulosis without acute inflammation. 4. Aortic Atherosclerosis (ICD10-I70.0).  Electronically Signed   By: Juliene Balder M.D.   On: 01/04/2024 15:45    Pertinent GI Studies   Screening colonoscopy June 2018 - Two 3 to 4 mm polyps in the rectum, removed with a cold snare. Resected and retrieved. - Moderate diverticulosis in the sigmoid colon and in the descending colon. - Internal hemorrhoids.  Surgical [P], rectum, polyp (2) - HYPERPLASTIC POLYP. - PROLAPSE TYPE POLYP. - NO DYSPLASIA OR MALIGNANCY  Past Medical History:  Diagnosis Date   Allergy    Diabetes mellitus without complication (HCC)    Environmental allergies    Hypertension    Sinus infection     Past Surgical History:  Procedure Laterality Date   NO PAST SURGERIES      Family History  Problem Relation Age of Onset  Ovarian cancer Mother    Diabetes Father    Colon cancer Neg Hx     Prior to Admission medications   Medication Sig Start Date End Date Taking? Authorizing Provider  acetaminophen  (TYLENOL ) 500 MG tablet Take 500 mg by mouth every 6 (six) hours as needed for moderate pain (pain score 4-6) or fever.   Yes [provider]  Chlorpheniramine Maleate (ALLERGY RELIEF PO) Take 1 tablet by mouth daily as needed (allergies).   Yes [provider]  fluticasone  (FLONASE ) 50 MCG/ACT nasal spray Place 1 spray into both nostrils daily.   Yes [provider]  atorvastatin  (LIPITOR) 40 MG tablet Take 1 tablet (40 mg total) by mouth daily. Patient not taking: Reported on 01/04/2024 09/07/18   Fleming, [name], NP  azelastine  (ASTELIN ) 0.1 % nasal spray Place 2 sprays into both nostrils 2 (two) times daily for 30 days. Use in each nostril as directed Patient not taking: Reported on 01/04/2024 09/06/18 10/06/18  Fleming, [name], NP  baclofen  (LIORESAL ) 10  MG tablet Take 1 tablet (10 mg total) by mouth 2 (two) times daily as needed for muscle spasms. Patient not taking: Reported on 01/04/2024 12/10/22   Raspet, Erin K, PA-C  lisinopril -hydrochlorothiazide  (PRINZIDE ,ZESTORETIC ) 10-12.5 MG tablet Take 1 tablet by mouth daily. Patient not taking: Reported on 01/04/2024 09/06/18   Fleming, [name], NP  metFORMIN  (GLUCOPHAGE ) 500 MG tablet Take 1 tablet (500 mg total) by mouth 2 (two) times daily with a meal. Patient not taking: Reported on 01/04/2024 09/06/18   Fleming, [name], NP    Current Facility-Administered Medications  Medication Dose Route Frequency Provider Last Rate Last Admin   docusate sodium  (COLACE) capsule 100 mg  100 mg Oral BID Claiborne, Claudia, MD       fluticasone  (FLONASE ) 50 MCG/ACT nasal spray 1 spray  1 spray Each Nare Daily Arthea Child, MD       folic acid  (FOLVITE ) tablet 1 mg  1 mg Oral Daily Claiborne, Claudia, MD   1 mg at 01/04/24 2002   hydrALAZINE  (APRESOLINE ) injection 10 mg  10 mg Intravenous Q4H PRN Howerter, Justin B, DO   10 mg at 01/05/24 0004   HYDROmorphone  (DILAUDID ) injection 0.5 mg  0.5 mg Intravenous Q2H PRN Howerter, Justin B, DO   0.5 mg at 01/05/24 0757   insulin  aspart (novoLOG ) injection 0-6 Units  0-6 Units Subcutaneous TID WC Arthea Child, MD       lactated ringers  infusion   Intravenous Continuous Claiborne, Claudia, MD 150 mL/hr at 01/04/24 2005 New Bag at 01/04/24 2005   naloxone  (NARCAN ) injection 0.4 mg  0.4 mg Intravenous PRN Howerter, Justin B, DO       ondansetron  (ZOFRAN ) tablet 4 mg  4 mg Oral Q6H PRN Arthea Child, MD       Or   ondansetron  (ZOFRAN ) injection 4 mg  4 mg Intravenous Q6H PRN Arthea Child, MD       oxymetazoline  (AFRIN) 0.05 % nasal spray 1 spray  1 spray Left Nare BID Claiborne, Claudia, MD   1 spray at 01/05/24 0024   pantoprazole  (PROTONIX ) injection 40 mg  40 mg Intravenous Q24H Claiborne, Claudia, MD   40 mg at 01/05/24 9177   thiamine  (VITAMIN B1)  tablet 100 mg  100 mg Oral Daily Claiborne, Claudia, MD   100 mg at 01/04/24 2002    Allergies as of 01/04/2024 - Review Complete 01/04/2024  Allergen Reaction Noted   Other Anaphylaxis 11/24/2016   Peanut-containing  drug products Anaphylaxis 11/24/2016    Social History   Socioeconomic History   Marital status: Single    Spouse name: Not on file   Number of children: Not on file   Years of education: Not on file   Highest education level: Not on file  Occupational History   Not on file  Tobacco Use   Smoking status: Every Day    Current packs/day: 0.50    Types: Cigarettes   Smokeless tobacco: Never  Vaping Use   Vaping status: Never Used  Substance and Sexual Activity   Alcohol use: Not Currently    Comment: beer 1-2 times per month per pt.   Drug use: Not Currently    Types: Marijuana    Comment: daily per pt last 12/05/16   Sexual activity: Yes  Other Topics Concern   Not on file  Social History Narrative   Not on file   Social Drivers of Health   Financial Resource Strain: Not on file  Food Insecurity: No Food Insecurity (01/05/2024)   Hunger Vital Sign    Worried About Running Out of Food in the Last Year: Never true    Ran Out of Food in the Last Year: Never true  Transportation Needs: No Transportation Needs (01/05/2024)   PRAPARE - Administrator, Civil Service (Medical): No    Lack of Transportation (Non-Medical): No  Physical Activity: Not on file  Stress: Not on file  Social Connections: Not on file  Intimate Partner Violence: Not At Risk (01/05/2024)   Humiliation, Afraid, Rape, and Kick questionnaire    Fear of Current or Ex-Partner: No    Emotionally Abused: No    Physically Abused: No    Sexually Abused: No     Code Status   Code Status: Full Code  Review of Systems: All systems reviewed and negative except where noted in HPI.  Physical Exam: Vital signs in last 24 hours: Temp:  [98.4 F (36.9 C)-99 F (37.2 C)] 98.4 F  (36.9 C) (07/10 0750) Pulse Rate:  [72-117] 93 (07/10 0750) Resp:  [14-18] 18 (07/10 0404) BP: (130-191)/(88-118) 148/96 (07/10 0750) SpO2:  [94 %-99 %] 96 % (07/10 0750) Weight:  [91.6 kg] 91.6 kg (07/09 2117) Last BM Date : 01/04/24  General:  Pleasant male in NAD Psych:  Cooperative. Normal mood and affect Eyes: Pupils equal Ears:  Normal auditory acuity Nose: No deformity, discharge or lesions Neck:  Supple, no masses felt Lungs:  Clear to auscultation.  Heart:  Regular rate, regular rhythm.  Abdomen:  Soft, nondistended, nontender, active bowel sounds, no masses felt Rectal :  Deferred Msk: Symmetrical without gross deformities.  Neurologic:  Alert, oriented, grossly normal neurologically Extremities : No edema Skin:  Intact without significant lesions.    Intake/Output from previous day: No intake/output data recorded. Intake/Output this shift:  No intake/output data recorded.   Vina Dasen, NP-C   01/05/2024, 8:37 AM  I have taken an interval history, thoroughly reviewed the chart and examined the patient. I agree with the Advanced Practitioner's note, impression and recommendations, and have recorded additional findings, impressions and recommendations below. I performed a substantive portion of this encounter (>50% time spent), including a complete performance of the medical decision making.  My additional thoughts are as follows:  58 year old man without chronic upper digestive symptoms became suddenly ill 2 to 3 days ago with upper abdominal pain and protracted vomiting.  CT scan shows abnormality distal stomach with wall thickening and perhaps  dilatation of the more proximal stomach.  Modest NSAID use. No hematemesis, hemoconcentrated with high hemoglobin.  Lack of chronic symptoms speaks against this being neoplasia or likely worrisome finding.  Probably foodborne illness/food poisoning, possibly newly acquired H. pylori.  He looks well without apparent  systemic infectious illness.  Upper endoscopy recommended today and he was agreeable after discussion procedure and risks.  The benefits and risks of the planned procedure(s) were described in detail with the patient or (when appropriate) their health care proxy.  Risks were outlined as including, but not limited to, bleeding, infection, perforation, adverse medication reaction leading to cardiac or pulmonary decompensation, pancreatitis (if ERCP).  The limitation of incomplete mucosal visualization was also discussed.  No guarantees or warranties were given.  If benign findings on exam, probably start full liquid diet today and advance as tolerated to regular diet and hope you may be feeling well enough to go home tomorrow with as needed antiemetics. Victory LITTIE Brand III Office:[phone]

## 2024-01-05 NOTE — Anesthesia Preprocedure Evaluation (Addendum)
 Anesthesia Evaluation  Patient identified by MRN, date of birth, ID band Patient awake    Reviewed: Allergy & Precautions, H&P , NPO status , Patient's Chart, lab work & pertinent test results  Airway Mallampati: II  TM Distance: >3 FB Neck ROM: Full    Dental no notable dental hx. (+) Teeth Intact, Dental Advisory Given   Pulmonary Current Smoker and Patient abstained from smoking.   Pulmonary exam normal breath sounds clear to auscultation       Cardiovascular hypertension, Pt. on medications  Rhythm:Regular Rate:Normal     Neuro/Psych negative neurological ROS  negative psych ROS   GI/Hepatic negative GI ROS, Neg liver ROS,,,  Endo/Other  diabetes, Type 2, Oral Hypoglycemic Agents  Class 3 obesity  Renal/GU negative Renal ROS  negative genitourinary   Musculoskeletal   Abdominal   Peds  Hematology negative hematology ROS (+)   Anesthesia Other Findings   Reproductive/Obstetrics negative OB ROS                              Anesthesia Physical Anesthesia Plan  ASA: 3  Anesthesia Plan: MAC   Post-op Pain Management: Minimal or no pain anticipated   Induction: Intravenous  PONV Risk Score and Plan: 0 and Propofol  infusion  Airway Management Planned: Natural Airway and Simple Face Mask  Additional Equipment:   Intra-op Plan:   Post-operative Plan:   Informed Consent: I have reviewed the patients History and Physical, chart, labs and discussed the procedure including the risks, benefits and alternatives for the proposed anesthesia with the patient or authorized representative who has indicated his/her understanding and acceptance.     Dental advisory given  Plan Discussed with: CRNA  Anesthesia Plan Comments:          Anesthesia Quick Evaluation

## 2024-01-06 ENCOUNTER — Other Ambulatory Visit (HOSPITAL_COMMUNITY): Payer: Self-pay

## 2024-01-06 DIAGNOSIS — K259 Gastric ulcer, unspecified as acute or chronic, without hemorrhage or perforation: Secondary | ICD-10-CM

## 2024-01-06 DIAGNOSIS — R7303 Prediabetes: Secondary | ICD-10-CM

## 2024-01-06 DIAGNOSIS — R933 Abnormal findings on diagnostic imaging of other parts of digestive tract: Secondary | ICD-10-CM

## 2024-01-06 LAB — CBC WITH DIFFERENTIAL/PLATELET
Abs Immature Granulocytes: 0.02 K/uL (ref 0.00–0.07)
Basophils Absolute: 0.1 K/uL (ref 0.0–0.1)
Basophils Relative: 1 %
Eosinophils Absolute: 0.5 K/uL (ref 0.0–0.5)
Eosinophils Relative: 5 %
HCT: 45.7 % (ref 39.0–52.0)
Hemoglobin: 15.4 g/dL (ref 13.0–17.0)
Immature Granulocytes: 0 %
Lymphocytes Relative: 24 %
Lymphs Abs: 2.6 K/uL (ref 0.7–4.0)
MCH: 30.9 pg (ref 26.0–34.0)
MCHC: 33.7 g/dL (ref 30.0–36.0)
MCV: 91.6 fL (ref 80.0–100.0)
Monocytes Absolute: 1.3 K/uL — ABNORMAL HIGH (ref 0.1–1.0)
Monocytes Relative: 12 %
Neutro Abs: 6.3 K/uL (ref 1.7–7.7)
Neutrophils Relative %: 58 %
Platelets: 174 K/uL (ref 150–400)
RBC: 4.99 MIL/uL (ref 4.22–5.81)
RDW: 14.4 % (ref 11.5–15.5)
WBC: 10.8 K/uL — ABNORMAL HIGH (ref 4.0–10.5)
nRBC: 0 % (ref 0.0–0.2)

## 2024-01-06 LAB — GLUCOSE, CAPILLARY
Glucose-Capillary: 148 mg/dL — ABNORMAL HIGH (ref 70–99)
Glucose-Capillary: 139 mg/dL — ABNORMAL HIGH (ref 70–99)

## 2024-01-06 LAB — COMPREHENSIVE METABOLIC PANEL WITH GFR
ALT: 22 U/L (ref 0–44)
AST: 27 U/L (ref 15–41)
Albumin: 3.4 g/dL — ABNORMAL LOW (ref 3.5–5.0)
Alkaline Phosphatase: 45 U/L (ref 38–126)
Anion gap: 10 (ref 5–15)
BUN: 14 mg/dL (ref 6–20)
CO2: 29 mmol/L (ref 22–32)
Calcium: 8.9 mg/dL (ref 8.9–10.3)
Chloride: 100 mmol/L (ref 98–111)
Creatinine, Ser: 1.22 mg/dL (ref 0.61–1.24)
GFR, Estimated: >60 mL/min (ref >60)
Glucose, Bld: 114 mg/dL — ABNORMAL HIGH (ref 70–99)
Potassium: 3.3 mmol/L — ABNORMAL LOW (ref 3.5–5.1)
Sodium: 139 mmol/L (ref 135–145)
Total Bilirubin: 0.9 mg/dL (ref 0.0–1.2)
Total Protein: 6.6 g/dL (ref 6.5–8.1)

## 2024-01-06 LAB — PHOSPHORUS: Phosphorus: 3.4 mg/dL (ref 2.5–4.6)

## 2024-01-06 LAB — MAGNESIUM: Magnesium: 1.7 mg/dL (ref 1.7–2.4)

## 2024-01-06 MED ORDER — PANTOPRAZOLE SODIUM 40 MG PO TBEC: 40.0000 mg | DELAYED_RELEASE_TABLET | Freq: Two times a day (BID) | ORAL | Status: DC

## 2024-01-06 MED ORDER — METFORMIN HCL 500 MG PO TABS
500.0000 mg | ORAL_TABLET | Freq: Two times a day (BID) | ORAL | 0 refills | Status: DC
Start: 2024-01-06 — End: 2024-01-24
  Filled 2024-01-06: qty 60, 30d supply, fill #0

## 2024-01-06 MED ORDER — MAGNESIUM SULFATE 2 GM/50ML IV SOLN
2.0000 g | Freq: Once | INTRAVENOUS | Status: AC
  Administered 2024-01-06: 2 g via INTRAVENOUS
  Filled 2024-01-06: qty 50

## 2024-01-06 MED ORDER — ONDANSETRON HCL 4 MG PO TABS
4.0000 mg | ORAL_TABLET | Freq: Four times a day (QID) | ORAL | 0 refills | Status: AC | PRN
  Filled 2024-01-06: qty 20, 5d supply, fill #0

## 2024-01-06 MED ORDER — THIAMINE HCL 100 MG PO TABS
100.0000 mg | ORAL_TABLET | Freq: Every day | ORAL | 0 refills | Status: DC
  Filled 2024-01-06: qty 30, 30d supply, fill #0

## 2024-01-06 MED ORDER — ATORVASTATIN CALCIUM 40 MG PO TABS
40.0000 mg | ORAL_TABLET | Freq: Every day | ORAL | 0 refills | Status: DC
  Filled 2024-01-06: qty 30, 30d supply, fill #0

## 2024-01-06 MED ORDER — POTASSIUM CHLORIDE CRYS ER 20 MEQ PO TBCR
40.0000 meq | EXTENDED_RELEASE_TABLET | Freq: Two times a day (BID) | ORAL | Status: DC
  Administered 2024-01-06: 40 meq via ORAL
  Filled 2024-01-06: qty 2

## 2024-01-06 MED ORDER — DOCUSATE SODIUM 100 MG PO CAPS
100.0000 mg | ORAL_CAPSULE | Freq: Two times a day (BID) | ORAL | 0 refills | Status: AC
  Filled 2024-01-06: qty 10, 5d supply, fill #0

## 2024-01-06 MED ORDER — PANTOPRAZOLE SODIUM 40 MG PO TBEC
40.0000 mg | DELAYED_RELEASE_TABLET | Freq: Two times a day (BID) | ORAL | 1 refills | Status: DC
  Filled 2024-01-06: qty 60, 30d supply, fill #0

## 2024-01-06 MED ORDER — FOLIC ACID 1 MG PO TABS
1.0000 mg | ORAL_TABLET | Freq: Every day | ORAL | 0 refills | Status: DC
  Filled 2024-01-06: qty 30, 30d supply, fill #0

## 2024-01-06 MED ORDER — SODIUM CHLORIDE 0.9 % IV SOLN: INTRAVENOUS | Status: DC | PRN

## 2024-01-06 MED ORDER — LISINOPRIL 10 MG PO TABS
10.0000 mg | ORAL_TABLET | Freq: Every day | ORAL | 0 refills | Status: DC
  Filled 2024-01-06: qty 30, 30d supply, fill #0

## 2024-01-06 NOTE — Plan of Care (Signed)

## 2024-01-06 NOTE — Progress Notes (Addendum)
 Daily Progress Note  DOA: 01/04/2024 Hospital Day: 3   Primary GI :  Jorge Starch, MD  Cc:  Gastric ulcers, esophagitis   ASSESSMENT    58 year old male with a history of hypertension, prediabetes versus diabetes, alcohol abuse (in remission), alcoholic pancreatitis.  Admitted  7/9 with nausea, vomiting, and abdominal pain.  CT scan showing abnormalities of the stomach. See 7/10 GI consult note   Nausea / vomiting Non-bleeding gastric ulcers with pigmented material. Duodenitis Ulcers chronic appearing with N/V more likely 2/2 to infectious gastroenteritis. >>TODAY:  He feels okay though he got IV dilaudid  around 7 am. No further N/V. Tolerating solids.    Grade B esophagitis  Leukocytosis Improved with hydration. WBC 10.8  Hypokalemia Repletion in progress  History of alcohol abuse with associated alcoholic pancreatitis Reportedly In remission.  No alcohol in 5 years   AKI Resolved   Principal Problem:   Nausea and vomiting Active Problems:   Essential hypertension   Prediabetes   Non-seasonal allergic rhinitis due to pollen   Abnormal finding on GI tract imaging   Multiple gastric ulcers   PLAN   --Continue BID pantoprazole  --Awaiting biopsies --Avoid NSAIDS. Frequently takes Ibuprofen  --? Home later today or in am. Our office will contact him with follow up   Subjective   He feels okay.  No further N/V. Tolerating solids.    Objective   GI Studies:   - LA Grade B reflux esophagitis with no bleeding. - Non-bleeding gastric ulcers with pigmented material. - Duodenitis. - Several biopsies were obtained in the gastric body and in the gastric antrum. These ulcers are clearly chronic and do not account for his acute nausea and vomiting, which is most likely from an acute foodborne illness based on the overall clinical picture. However, these ulcers and the adjacent inflammation do explain the findings on CT scan.   Recent Labs    01/04/24 1221  01/05/24 0906 01/06/24 0605  WBC 19.3* 15.2* 10.8*  HGB 17.8* 15.6 15.4  HCT 53.2* 46.1 45.7  MCV 89.1 88.7 91.6  PLT 256 226 174   No results for input(s): FOLATE, VITAMINB12, FERRITIN, TIBC, IRONPCTSAT in the last 72 hours. Recent Labs    01/05/24 0515 01/05/24 0906 01/06/24 0605  NA 137 138 139  K 3.6 3.5 3.3*  CL 96* 97* 100  CO2 30 32 29  GLUCOSE 123* 133* 114*  BUN 16 16 14   CREATININE 1.31* 1.21 1.22  CALCIUM  9.6 9.1 8.9   Recent Labs    01/05/24 0515 01/05/24 0906 01/06/24 0605  PROT 7.7 6.8 6.6  ALBUMIN 3.6 3.4* 3.4*  AST 27 22 27   ALT 25 23 22   ALKPHOS 55 50 45  BILITOT 0.7 0.9 0.9    Imaging:  CT ABDOMEN PELVIS W CONTRAST CLINICAL DATA:  Abdominal pain, acute, nonlocalized. Abdominal pain and vomiting for 3 days.  EXAM: CT ABDOMEN AND PELVIS WITH CONTRAST  TECHNIQUE: Multidetector CT imaging of the abdomen and pelvis was performed using the standard protocol following bolus administration of intravenous contrast.  RADIATION DOSE REDUCTION: This exam was performed according to the departmental dose-optimization program which includes automated exposure control, adjustment of the mA and/or kV according to patient size and/or use of iterative reconstruction technique.  CONTRAST:  75mL OMNIPAQUE  IOHEXOL  350 MG/ML SOLN  COMPARISON:  03/07/2023  FINDINGS: Lower chest: Focal nodular density along the medial left lung base on image 36/5 is stable since 07/07/2020 and most compatible with a focus of  scarring. Otherwise, the lung bases are clear.  Hepatobiliary: Normal appearance of the liver, gallbladder and portal venous system. No biliary dilatation.  Pancreas: Unremarkable. No pancreatic ductal dilatation or surrounding inflammatory changes.  Spleen: Normal in size without focal abnormality.  Adrenals/Urinary Tract: Normal adrenal glands. No hydronephrosis. No suspicious renal lesions. Normal urinary bladder.  Stomach/Bowel:  Limited evaluation of the GE junction but similar to the previous examination. Mild dilatation of the proximal stomach and there is diffuse narrowing in the distal stomach at the antrum and pylorus region. Wall thickness measures up to 1.8 cm in the distal stomach. The wall thickness and narrowing in the distal stomach persists on the delayed images. Normal appearance of the duodenum. No significant inflammatory changes around the stomach. No small bowel dilatation. Scattered colonic diverticula without acute colonic inflammation. Normal appendix.  Vascular/Lymphatic: Aortic atherosclerosis. No enlarged abdominal or pelvic lymph nodes.  Reproductive: Prostate is unremarkable.  Other: No free fluid. No evidence for free air. Small umbilical hernia containing fat.  Musculoskeletal: No acute bone abnormality.  IMPRESSION: 1. Abnormal appearance of the distal stomach with diffuse wall thickening and narrowing. Findings are nonspecific. Findings could represent gastritis but a neoplastic process cannot be excluded. Recommend GI consultation. 2. No other acute abnormality in the abdomen or pelvis. 3. Colonic diverticulosis without acute inflammation. 4. Aortic Atherosclerosis (ICD10-I70.0).  Electronically Signed   By: Juliene Balder M.D.   On: 01/04/2024 15:45     Scheduled inpatient medications:   docusate sodium   100 mg Oral BID   fluticasone   1 spray Each Nare Daily   folic acid   1 mg Oral Daily   insulin  aspart  0-6 Units Subcutaneous TID WC   oxymetazoline   1 spray Left Nare BID   pantoprazole  (PROTONIX ) IV  40 mg Intravenous Q24H   potassium chloride   40 mEq Oral BID   thiamine   100 mg Oral Daily   Continuous inpatient infusions:   magnesium  sulfate bolus IVPB     PRN inpatient medications: hydrALAZINE , HYDROmorphone  (DILAUDID ) injection, naLOXone  (NARCAN )  injection, ondansetron  **OR** ondansetron  (ZOFRAN ) IV  Vital signs in last 24 hours: Temp:  [97.8 F (36.6  C)-99.5 F (37.5 C)] 98.2 F (36.8 C) (07/11 0753) Pulse Rate:  [75-97] 75 (07/11 0753) Resp:  [15-19] 18 (07/11 0753) BP: (120-170)/(81-112) 152/94 (07/11 0753) SpO2:  [92 %-97 %] 96 % (07/11 0753) Weight:  [90.7 kg] 90.7 kg (07/10 1330) Last BM Date : 01/05/24  Intake/Output Summary (Last 24 hours) at 01/06/2024 1036 Last data filed at 01/05/2024 1459 Gross per 24 hour  Intake 750 ml  Output 1 ml  Net 749 ml    Intake/Output from previous day: 07/10 0701 - 07/11 0700 In: 750 [I.V.:750] Out: 1 [Blood:1] Intake/Output this shift: No intake/output data recorded.   Physical Exam:  General: Sleepy,  in NAD Heart:  Regular rate and rhythm.  Pulmonary: Normal respiratory effort Abdomen: Soft, nondistended, nontender. Normal bowel sounds. Neurologic: Alert and oriented Psych: Pleasant. Cooperative     LOS: 2 days   Vina Dasen ,NP 01/06/2024, 10:36 AM   I have taken an interval history, thoroughly reviewed the chart and examined the patient. I agree with the Advanced Practitioner's note, impression and recommendations, and have recorded additional findings, impressions and recommendations below. I performed a substantive portion of this encounter (>50% time spent), including a complete performance of the medical decision making.  My additional thoughts are as follows:  He says his abdominal pain has diminished, his vomiting is stopped  and he is able to tolerate a regular diet today.  Anesthesia CRNA was wrapping up a discussion about his dental injury from yesterday's endoscopy when I got there.  Patient seems understanding about that, though his wife Charlies is visibly upset about it and that led to some discord between her and Lorris during my visit.  His abdomen is soft and nontender  I discussed the EGD findings again with him and his wife, and she had also seen the EGD report from yesterday.  Strongly recommended avoiding aspirin and all NSAIDs (and listed some,  OTC NSAIDs).  Will take twice daily acid suppression for the next 8 weeks and we will contact him with the biopsy results, treating for H. pylori if discovered.  I also told him we typically need to make office follow-up plans for this to discuss a future endoscopy to confirm ulcer healing as well as possibly H. pylori testing (often by stool of breath testing, or by gastric biopsy if repeat EGD done).  I understand he is without insurance and that may be challenging for him to do.  We will do our best to help navigate that.  Agree he can be discharged home today, and communicated that to the [facility] physician.   Victory LITTIE Brand III Office:[phone]

## 2024-01-06 NOTE — Progress Notes (Signed)
  MATCH MEDICATION ASSISTANCE CARD Pharmacies please call [phone] for claim processing assistance.  Rx BIN: [id number] Rx Group: [id number] Rx PCN: [id number] Relationship Code: 1 Person Code: 01  Patient ID (MRN): [id number]    Patient Name: Jorge [name]   Patient DOB January 03, 1966   Discharge Date:01/06/2024  Expiration Date:01/13/2024 (must be filled within 7 days of discharge)

## 2024-01-06 NOTE — TOC Transition Note (Signed)
 Transition of Care [facility]) - Discharge Note   Patient Details  Name: Jorge [name] MRN: [id number] Date of Birth: 1965/09/01  [facility] CM/SW Contact:  Roxie KANDICE Stain, RN Phone Number: 01/06/2024, 1:53 PM   Clinical Narrative:    Patient stable for discharge.  MATCH letter sent to [facility] pharmacy.  No other TOC needs at this time.     Final next level of care: Home/Self Care Barriers to Discharge: Barriers Resolved   Patient Goals and CMS Choice Patient states their goals for this hospitalization and ongoing recovery are:: return home          Discharge Placement                 Home      Discharge Plan and Services Additional resources added to the After Visit Summary for     Discharge Planning Services: MATCH Program                                 Social Drivers of Health (SDOH) Interventions SDOH Screenings   Food Insecurity: No Food Insecurity (01/05/2024)  Housing: Low Risk  (01/05/2024)  Transportation Needs: No Transportation Needs (01/05/2024)  Utilities: Not At Risk (01/05/2024)  Depression (PHQ2-9): Low Risk  (09/06/2018)  Tobacco Use: High Risk (01/05/2024)     Readmission Risk Interventions    01/06/2024    1:52 PM  Readmission Risk Prevention Plan  Post Dischage Appt Complete  Medication Screening Complete  Transportation Screening Complete

## 2024-01-09 ENCOUNTER — Encounter: Payer: Self-pay | Admitting: Physician Assistant

## 2024-01-09 ENCOUNTER — Telehealth: Payer: Self-pay

## 2024-01-09 NOTE — Discharge Summary (Signed)
 Physician Discharge Summary   Patient: Jorge [name] MRN: [id number] DOB: 04-Aug-1965  Admit date:     01/04/2024  Discharge date: 01/06/2024  Discharge Physician: Alejandro Marker, DO   PCP: Theotis Haze ORN, NP   Recommendations at discharge:   Follow-up with PCP within 1 to 2 weeks repeat CBC, CMP, mag, Phos within 1 week Follow-up with gastroenterology in outpatient setting within 1 to 2 weeks; avoid NSAIDs and aspirin and continue twice daily PPI for 8 weeks and will follow-up in outpatient setting with biopsy results for H. pylori.  Discharge Diagnoses: Principal Problem:   Nausea and vomiting Active Problems:   Essential hypertension   Prediabetes   Non-seasonal allergic rhinitis due to pollen   Abnormal finding on GI tract imaging   Multiple gastric ulcers  Resolved Problems:   * No resolved hospital problems. [facility] Course: The patient is 58 yo obese AAM with a past medical history significant for but not limited to essential hypertension, diabetes mellitus type 2, history of alcohol abuse and alcoholic pancreatitis who presents with a 2-day history of intractable nausea and vomiting.  He reported that he went to a cookout over the Fourth of July weekend and ate some leftover food Monday and awoke in the middle night vomiting 2 nights ago and has had recurrent vomiting since then.  He describes the vomitus as nonbloody and nonbilious.  Started having abdominal discomfort and pain mainly in epigastric area.  Given his symptoms and worsening he presented to the ED and CT scan abdomen pelvis was done and revealed diffuse wall thickening and narrowing in the area of the distal stomach.  He is placed empirically on PPI, given IV fluid hydration as well as pain control and GI was consulted.  GI is now pursuing an EGD.  EGD was done and showed LA grade B reflux esophagitis with no bleeding and nonbleeding gastric ulcers with pigmented material as well as duodenitis.  Biopsies were sent  and patient was placed on twice daily PPI.  He he improved and tolerated his meals and diet was aggressive soft and tolerated this without issues.  He is medically stable for discharge at this time will need to follow-up with PCP and gastroenterology in outpatient setting.  Assessment and Plan:  Intractable Nausea and Vomiting, Abdominal Pain with associated abnormal CT w swelling of Distal Stomach: Had Vomiting x 2 days which was Non-bloody and Non-bilious. CT Abd/Pelvis done and showed Nonspecific findings of an abnormal appearance of the distal stomach with diffuse wall thickening and narrowing. This could have represented gastritis but a neoplastic process cannot be excluded. No other findings in the Abd/Pelvis CT were noted except colonic diverticulosis w/o Acute inflammation and Aortic atherosclerosis.  -GI ([facility]) consulted and plan is for EGD; EGD done and showed chronic ulcers and gastritis. -Pain Control and Supportive Care w/ Antiemetics.  -WBC elevated in the setting of Dehydration from N/V and went from 19.3 -> 15.2 and is improved further to 10 point.  -C/w  IV Protonix  empirically and changed to twice daily PPI given EGD findings.    Diabetes Mellitus Type 2: Improved his HbA1c from 6.7 -> 6.3 but that was back in 2020. Check HbA1c in the AM. Getting IVF as below. Hold Metformin  500 mg po BID due to CT imaging. C/w Very Sensitive Novolog  SSI AC. CTM CBG's per Protocol and they are ranging from 109-141   Essential Hypertension: At Home takes Lisinopril -hydrochlorothiazide  10-12.5 mg po Daily. An AIIRB would be more  effective and not risk the hypokalemia. CTM BP per protocol. C/w IV Hydralazine  10 mg q4hprn for SBP>170 or DBP>100. Last BP reading was 150/112.   History of alcohol abuse and alcoholic pancreatitis: The patient has been sober for 6 years. Lipase level mildly elevated at 59 and no evidence of Pancreatitis on CT Scan.    Post Nasal Drip left nare only: Has been having this  for >1 month. He was given Oxymetazoline  2 Spray each nare Daily  because his high blood pressure precludes decongestants. Now on Fluticasone  Nasal Spray 1 Spray each Nare daily    Erythrocytosis: Looks like he was Hemoconcentrated on Admit due to N/V. Hgb/Hct went from 17.8/53.2 -> 15.6/46.1 and is now 15.4/45.7. CTM and Trend and repeat CBC in the AM   AKI/Renal Insufficiency: Improving. BUN/Cr Trend: Recent Labs  Lab 01/04/24 1221 01/05/24 0515 01/05/24 0906 01/06/24 0605  BUN 22* 16 16 14   CREATININE 1.56* 1.31* 1.21 1.22  -Currently getting IVF Hydration with LR @ 75 mL/hr; Given a Bolus of NS 1 Liter -Avoid Nephrotoxic Medications, Contrast Dyes, Hypotension and Dehydration to Ensure Adequate Renal Perfusion and will need to Renally Adjust Meds -Continue to Monitor and Trend Renal Function carefully and repeat CMP in the AM   Hyperbilirubinemia: Improved. T Bili went from 1.5 -> 0.7 -> 0.9. CTM and Trend and repeat CMP in the AM  Hypoalbuminemia: Albumin Lvl trending down and went from 3.9 -> 3.6 -> 3.4. CTM and Trend and repeat CMP in the AM  Class I Obesity: Complicates overall prognosis and care. Estimated body mass index is 34.33 kg/m as calculated from the following:   Height as of this encounter: 5' 4 (1.626 m).   Weight as of this encounter: 90.7 kg. Weight Loss and Dietary Counseling given  Consultants: Gastroenterology Procedures performed: EGD Disposition: Home Diet recommendation:  Discharge Diet Orders (From admission, onward)     Start     Ordered   01/06/24 0000  Diet - low sodium heart healthy       Comments: SOFT Diet   01/06/24 1345           Cardiac and Carb modified diet DISCHARGE MEDICATION: Allergies as of 01/06/2024       Reactions   Other Anaphylaxis   Allergy to dust and dirt if blow in front of his face causing swell of the eyes and short of breath    Peanut-containing Drug Products Anaphylaxis        Medication List     STOP  taking these medications    baclofen  10 MG tablet Commonly known as: LIORESAL    lisinopril -hydrochlorothiazide  10-12.5 MG tablet Commonly known as: ZESTORETIC        TAKE these medications    acetaminophen  500 MG tablet Commonly known as: TYLENOL  Take 500 mg by mouth every 6 (six) hours as needed for moderate pain (pain score 4-6) or fever.   ALLERGY RELIEF PO Take 1 tablet by mouth daily as needed (allergies).   atorvastatin  40 MG tablet Commonly known as: LIPITOR Take 1 tablet (40 mg total) by mouth daily.   azelastine  0.1 % nasal spray Commonly known as: ASTELIN  Place 2 sprays into both nostrils 2 (two) times daily for 30 days. Use in each nostril as directed   docusate sodium  100 MG capsule Commonly known as: COLACE Take 1 capsule (100 mg total) by mouth 2 (two) times daily.   fluticasone  50 MCG/ACT nasal spray Commonly known as: FLONASE  Place 1 spray into both  nostrils daily.   folic acid  1 MG tablet Commonly known as: FOLVITE  Take 1 tablet (1 mg total) by mouth daily.   lisinopril  10 MG tablet Commonly known as: ZESTRIL  Take 1 tablet (10 mg total) by mouth daily.   metFORMIN  500 MG tablet Commonly known as: GLUCOPHAGE  Take 1 tablet (500 mg total) by mouth 2 (two) times daily with a meal.   ondansetron  4 MG tablet Commonly known as: ZOFRAN  Take 1 tablet (4 mg total) by mouth every 6 (six) hours as needed for nausea.   pantoprazole  40 MG tablet Commonly known as: PROTONIX  Take 1 tablet (40 mg total) by mouth 2 (two) times daily.   thiamine  100 MG tablet Commonly known as: VITAMIN B1 Take 1 tablet (100 mg total) by mouth daily.        Follow-up Information     Theotis Haze ORN, NP Follow up in 2 week(s).   Specialty: Nurse Practitioner Why: Hospital follow up Contact information: [address] [place] [address] [phone]                Discharge Exam: [facility] Weights   01/04/24 2117 01/05/24 1330  Weight: 91.6 kg 90.7 kg    Vitals:   01/06/24 0357 01/06/24 0753  BP: (!) 170/94 (!) 152/94  Pulse: 97 75  Resp: 19 18  Temp: 98.6 F (37 C) 98.2 F (36.8 C)  SpO2: 97% 96%   Examination: Physical Exam:  Constitutional: WN/WD obese African-American male in no acute distress appears calm Respiratory: Diminished to auscultation bilaterally, no wheezing, rales, rhonchi or crackles. Normal respiratory effort and patient is not tachypenic. No accessory muscle use.  Unlabored breathing Cardiovascular: RRR, no murmurs / rubs / gallops. S1 and S2 auscultated. No extremity edema.  Abdomen: Soft, non-tender, distended secondary to body habitus bowel sounds positive.  GU: Deferred. Musculoskeletal: No clubbing / cyanosis of digits/nails. No joint deformity upper and lower extremities.  Skin: No rashes, lesions, ulcers on limited skin evaluation. No induration; Warm and dry.  Neurologic: CN 2-12 grossly intact with no focal deficits.  Romberg sign and cerebellar reflexes not assessed.  Psychiatric: Normal judgment and insight. Alert and oriented x 3. Normal mood and appropriate affect.   Condition at discharge: stable  The results of significant diagnostics from this hospitalization (including imaging, microbiology, ancillary and laboratory) are listed below for reference.   Imaging Studies: CT ABDOMEN PELVIS W CONTRAST Result Date: 01/04/2024 CLINICAL DATA:  Abdominal pain, acute, nonlocalized. Abdominal pain and vomiting for 3 days. EXAM: CT ABDOMEN AND PELVIS WITH CONTRAST TECHNIQUE: Multidetector CT imaging of the abdomen and pelvis was performed using the standard protocol following bolus administration of intravenous contrast. RADIATION DOSE REDUCTION: This exam was performed according to the departmental dose-optimization program which includes automated exposure control, adjustment of the mA and/or kV according to patient size and/or use of iterative reconstruction technique. CONTRAST:  75mL OMNIPAQUE  IOHEXOL  350  MG/ML SOLN COMPARISON:  03/07/2023 FINDINGS: Lower chest: Focal nodular density along the medial left lung base on image 36/5 is stable since 07/07/2020 and most compatible with a focus of scarring. Otherwise, the lung bases are clear. Hepatobiliary: Normal appearance of the liver, gallbladder and portal venous system. No biliary dilatation. Pancreas: Unremarkable. No pancreatic ductal dilatation or surrounding inflammatory changes. Spleen: Normal in size without focal abnormality. Adrenals/Urinary Tract: Normal adrenal glands. No hydronephrosis. No suspicious renal lesions. Normal urinary bladder. Stomach/Bowel: Limited evaluation of the GE junction but similar to the previous examination. Mild dilatation of  the proximal stomach and there is diffuse narrowing in the distal stomach at the antrum and pylorus region. Wall thickness measures up to 1.8 cm in the distal stomach. The wall thickness and narrowing in the distal stomach persists on the delayed images. Normal appearance of the duodenum. No significant inflammatory changes around the stomach. No small bowel dilatation. Scattered colonic diverticula without acute colonic inflammation. Normal appendix. Vascular/Lymphatic: Aortic atherosclerosis. No enlarged abdominal or pelvic lymph nodes. Reproductive: Prostate is unremarkable. Other: No free fluid. No evidence for free air. Small umbilical hernia containing fat. Musculoskeletal: No acute bone abnormality. IMPRESSION: 1. Abnormal appearance of the distal stomach with diffuse wall thickening and narrowing. Findings are nonspecific. Findings could represent gastritis but a neoplastic process cannot be excluded. Recommend GI consultation. 2. No other acute abnormality in the abdomen or pelvis. 3. Colonic diverticulosis without acute inflammation. 4. Aortic Atherosclerosis (ICD10-I70.0). Electronically Signed   By: Juliene Balder M.D.   On: 01/04/2024 15:45   Microbiology: No results found for this or any previous  visit.  Labs: CBC: Recent Labs  Lab 01/04/24 1221 01/05/24 0906 01/06/24 0605  WBC 19.3* 15.2* 10.8*  NEUTROABS  --  10.9* 6.3  HGB 17.8* 15.6 15.4  HCT 53.2* 46.1 45.7  MCV 89.1 88.7 91.6  PLT 256 226 174   Basic Metabolic Panel: Recent Labs  Lab 01/04/24 1221 01/05/24 0515 01/05/24 0906 01/06/24 0605  NA 137 137 138 139  K 3.5 3.6 3.5 3.3*  CL 91* 96* 97* 100  CO2 30 30 32 29  GLUCOSE 163* 123* 133* 114*  BUN 22* 16 16 14   CREATININE 1.56* 1.31* 1.21 1.22  CALCIUM  9.2 9.6 9.1 8.9  MG  --  1.7 1.7 1.7  PHOS  --   --  2.8 3.4   Liver Function Tests: Recent Labs  Lab 01/04/24 1221 01/05/24 0515 01/05/24 0906 01/06/24 0605  AST 33 27 22 27   ALT 28 25 23 22   ALKPHOS 54 55 50 45  BILITOT 1.5* 0.7 0.9 0.9  PROT 7.6 7.7 6.8 6.6  ALBUMIN 3.9 3.6 3.4* 3.4*   CBG: Recent Labs  Lab 01/05/24 0752 01/05/24 1203 01/05/24 1653 01/06/24 0749 01/06/24 1154  GLUCAP 141* 109* 123* 148* 139*   Discharge time spent: greater than 30 minutes.  Signed: Alejandro Marker, DO [facility] 01/09/2024

## 2024-01-09 NOTE — Transitions of Care (Post Inpatient/ED Visit) (Signed)
   01/09/2024  Name: Yahya Boldman MRN: [id number] DOB: 1966/01/28  Today's TOC FU Call Status: Today's TOC FU Call Status:: Unsuccessful Call (1st Attempt) Unsuccessful Call (1st Attempt) Date: 01/09/24  Attempted to reach the patient regarding the most recent Inpatient/ED visit.  Follow Up Plan: Additional outreach attempts will be made to reach the patient to complete the Transitions of Care (Post Inpatient/ED visit) call.   Signature  Slater Diesel, RN

## 2024-01-10 ENCOUNTER — Telehealth: Payer: Self-pay

## 2024-01-10 LAB — SURGICAL PATHOLOGY

## 2024-01-10 NOTE — Transitions of Care (Post Inpatient/ED Visit) (Signed)
   01/10/2024  Name: Jorge [name] MRN: [id number] DOB: [birth year]  Today's TOC FU Call Status: Today's TOC FU Call Status:: Unsuccessful Call (2nd Attempt) Unsuccessful Call (1st Attempt) Date: 01/09/24 Unsuccessful Call (2nd Attempt) Date: 01/10/24  Attempted to reach the patient regarding the most recent Inpatient/ED visit.  Follow Up Plan: Additional outreach attempts will be made to reach the patient to complete the Transitions of Care (Post Inpatient/ED visit) call.   Signature  Slater Diesel, RN

## 2024-01-11 ENCOUNTER — Telehealth: Payer: Self-pay

## 2024-01-11 NOTE — Transitions of Care (Post Inpatient/ED Visit) (Signed)
   01/11/2024  Name: Jorge [name] MRN: [id number] DOB: 1965/07/20  Today's TOC FU Call Status: Today's TOC FU Call Status:: Unsuccessful Call (3rd Attempt) Unsuccessful Call (1st Attempt) Date: 01/09/24 Unsuccessful Call (2nd Attempt) Date: 01/10/24 Unsuccessful Call (3rd Attempt) Date: 01/11/24  Attempted to reach the patient regarding the most recent Inpatient/ED visit.  Follow Up Plan: No further outreach attempts will be made at this time. We have been unable to contact the patient.  Letter sent to patient requesting he call [facility] to schedule a follow up appointment as we have not been able to reach him.  Haze Servant, NP is listed as PCP but the patient has not been seen since 09/06/2018.   Signature  Slater Diesel, RN

## 2024-01-12 ENCOUNTER — Ambulatory Visit: Payer: Self-pay | Admitting: Gastroenterology

## 2024-01-13 ENCOUNTER — Encounter (HOSPITAL_COMMUNITY): Payer: Self-pay

## 2024-01-13 ENCOUNTER — Other Ambulatory Visit: Payer: Self-pay

## 2024-01-13 ENCOUNTER — Ambulatory Visit (HOSPITAL_COMMUNITY)
Admission: EM | Admit: 2024-01-13 | Discharge: 2024-01-13 | Disposition: A | Discharge status: Home / Self Care | Attending: Emergency Medicine | Admitting: Emergency Medicine

## 2024-01-13 DIAGNOSIS — T783XXA Angioneurotic edema, initial encounter: Secondary | ICD-10-CM | POA: Diagnosis present

## 2024-01-13 DIAGNOSIS — R1012 Left upper quadrant pain: Secondary | ICD-10-CM | POA: Diagnosis present

## 2024-01-13 DIAGNOSIS — T464X5A Adverse effect of angiotensin-converting-enzyme inhibitors, initial encounter: Secondary | ICD-10-CM | POA: Insufficient documentation

## 2024-01-13 LAB — COMPREHENSIVE METABOLIC PANEL WITH GFR
ALT: 34 U/L (ref 0–44)
AST: 23 U/L (ref 15–41)
Albumin: 3.6 g/dL (ref 3.5–5.0)
Alkaline Phosphatase: 60 U/L (ref 38–126)
Anion gap: 9 (ref 5–15)
BUN: 18 mg/dL (ref 6–20)
CO2: 26 mmol/L (ref 22–32)
Calcium: 9.4 mg/dL (ref 8.9–10.3)
Chloride: 103 mmol/L (ref 98–111)
Creatinine, Ser: 1.35 mg/dL — ABNORMAL HIGH (ref 0.61–1.24)
GFR, Estimated: >60 mL/min (ref >60)
Glucose, Bld: 94 mg/dL (ref 70–99)
Potassium: 4.8 mmol/L (ref 3.5–5.1)
Sodium: 138 mmol/L (ref 135–145)
Total Bilirubin: 0.4 mg/dL (ref 0.0–1.2)
Total Protein: 7.3 g/dL (ref 6.5–8.1)

## 2024-01-13 LAB — CBC
HCT: 46.1 % (ref 39.0–52.0)
Hemoglobin: 15.4 g/dL (ref 13.0–17.0)
MCH: 30.7 pg (ref 26.0–34.0)
MCHC: 33.4 g/dL (ref 30.0–36.0)
MCV: 91.8 fL (ref 80.0–100.0)
Platelets: 295 K/uL (ref 150–400)
RBC: 5.02 MIL/uL (ref 4.22–5.81)
RDW: 14.5 % (ref 11.5–15.5)
WBC: 12.3 K/uL — ABNORMAL HIGH (ref 4.0–10.5)
nRBC: 0 % (ref 0.0–0.2)

## 2024-01-13 LAB — LIPASE, BLOOD: Lipase: 40 U/L (ref 11–51)

## 2024-01-13 MED ORDER — CETIRIZINE HCL 10 MG PO TABS
10.0000 mg | ORAL_TABLET | Freq: Every day | ORAL | 0 refills | Status: DC
  Filled 2024-01-13 – 2024-01-17 (×2): qty 30, 30d supply, fill #0

## 2024-01-13 MED ORDER — DEXAMETHASONE SODIUM PHOSPHATE 10 MG/ML IJ SOLN
INTRAMUSCULAR | Status: AC
  Filled 2024-01-13: qty 1

## 2024-01-13 MED ORDER — METOPROLOL SUCCINATE ER 25 MG PO TB24
25.0000 mg | ORAL_TABLET | Freq: Every day | ORAL | 1 refills | Status: DC
  Filled 2024-01-13: qty 30, 30d supply, fill #0

## 2024-01-13 MED ORDER — PREDNISONE 20 MG PO TABS
40.0000 mg | ORAL_TABLET | Freq: Every day | ORAL | 0 refills | Status: AC
  Filled 2024-01-13 – 2024-01-17 (×2): qty 6, 3d supply, fill #0

## 2024-01-13 MED ORDER — DEXAMETHASONE SODIUM PHOSPHATE 10 MG/ML IJ SOLN
10.0000 mg | Freq: Once | INTRAMUSCULAR | Status: AC
  Administered 2024-01-13: 10 mg via INTRAMUSCULAR

## 2024-01-13 NOTE — ED Notes (Signed)
 Patient reports that he went to work today and began having lower lip swelling. Patient states he does not have tongue or throat swelling. No problems with breathing or swallowing.  Noted that the patient does take Lisinopril .

## 2024-01-13 NOTE — ED Provider Notes (Signed)
 [facility]    CSN: [id number] Arrival date & time: 01/13/24  1020      History   Chief Complaint Chief Complaint  Patient presents with   Allergic Reaction    Patient states he went to work today and began having lip swell    HPI Jorge [name] is a 58 y.o. male.   Patient presents with lower lip swelling that began while he was at work today.  Patient denies tongue swelling, throat swelling, trouble breathing, and hives.  Patient was recently seen in the hospital and restarted on his lisinopril .  Patient states that he was previously prescribed lisinopril  back in October of last year.  Patient states that he did not follow-up with his primary care provider and has not been taking it since then until the day after he was discharged from the hospital.  Patient does also report continued left upper quadrant pain after being seen in the emergency department.  Patient was found to have chronic ulcers and gastritis on EGD and given GI follow-up and has an appointment scheduled on August 9 with them.  Patient denies worsening symptoms, but is concerned because his pain has continued and is concerned that it may get worse soon.  Denies nausea, vomiting, diarrhea, chest pain, and shortness of breath.  The history is provided by the patient and medical records.  Allergic Reaction   Past Medical History:  Diagnosis Date   Allergy    Diabetes mellitus without complication (HCC)    Environmental allergies    Hypertension    Sinus infection     Patient Active Problem List   Diagnosis Date Noted   Abnormal finding on GI tract imaging 01/05/2024   Multiple gastric ulcers 01/05/2024   Nausea and vomiting 01/04/2024   Essential hypertension 09/06/2018   Prediabetes 09/06/2018   Non-seasonal allergic rhinitis due to pollen 09/06/2018    Past Surgical History:  Procedure Laterality Date   ESOPHAGOGASTRODUODENOSCOPY N/A 01/05/2024   Procedure: EGD  (ESOPHAGOGASTRODUODENOSCOPY);  Surgeon: [name], MD;  Location: [facility] ENDOSCOPY;  Service: Gastroenterology;  Laterality: N/A;   NO PAST SURGERIES         Home Medications    Prior to Admission medications   Medication Sig Start Date End Date Taking? Authorizing Provider  cetirizine  (ZYRTEC  ALLERGY) 10 MG tablet Take 1 tablet (10 mg total) by mouth daily. 01/13/24  Yes [name] A, NP  metoprolol succinate (TOPROL-XL) 25 MG 24 hr tablet Take 1 tablet (25 mg total) by mouth daily. 01/13/24  Yes Johnie, Talley Casco A, NP  predniSONE (DELTASONE) 20 MG tablet Take 2 tablets (40 mg total) by mouth daily for 3 days. 01/13/24 01/16/24 Yes [name] A, NP  atorvastatin  (LIPITOR) 40 MG tablet Take 1 tablet (40 mg total) by mouth daily. 01/06/24   Sheikh, [name], DO  azelastine  (ASTELIN ) 0.1 % nasal spray Place 2 sprays into both nostrils 2 (two) times daily for 30 days. Use in each nostril as directed Patient not taking: Reported on 01/04/2024 09/06/18 10/06/18  Fleming, [name], NP  Chlorpheniramine Maleate (ALLERGY RELIEF PO) Take 1 tablet by mouth daily as needed (allergies).    [provider]  docusate sodium  (COLACE) 100 MG capsule Take 1 capsule (100 mg total) by mouth 2 (two) times daily. 01/06/24   Sheikh, [name], DO  fluticasone  (FLONASE ) 50 MCG/ACT nasal spray Place 1 spray into both nostrils daily.    [provider]  folic acid  (FOLVITE ) 1 MG  tablet Take 1 tablet (1 mg total) by mouth daily. 01/07/24   [name], DO  lisinopril  (ZESTRIL ) 10 MG tablet Take 1 tablet (10 mg total) by mouth daily. 01/06/24 01/05/25  [name], DO  metFORMIN  (GLUCOPHAGE ) 500 MG tablet Take 1 tablet (500 mg total) by mouth 2 (two) times daily with a meal. 01/06/24   Sheikh, [name], DO  ondansetron  (ZOFRAN ) 4 MG tablet Take 1 tablet (4 mg total) by mouth every 6 (six) hours as needed for nausea. 01/06/24   [name], DO  pantoprazole  (PROTONIX )  40 MG tablet Take 1 tablet (40 mg total) by mouth 2 (two) times daily. 01/06/24   [name], DO  thiamine  (VITAMIN B1) 100 MG tablet Take 1 tablet (100 mg total) by mouth daily. 01/07/24   [name], DO    Family History Family History  Problem Relation Age of Onset   Ovarian cancer Mother    Diabetes Father    Colon cancer Neg Hx     Social History Social History   Tobacco Use   Smoking status: Every Day    Current packs/day: 0.50    Types: Cigarettes   Smokeless tobacco: Never  Vaping Use   Vaping status: Never Used  Substance Use Topics   Alcohol use: Not Currently   Drug use: Yes    Types: Marijuana    Comment: daily per pt last 12/05/16     Allergies   Other and Peanut-containing drug products   Review of Systems Review of Systems  Per HPI  Physical Exam Triage Vital Signs ED Triage Vitals  Encounter Vitals Group     BP 01/13/24 1101 (!) 146/106     Girls Systolic BP Percentile --      Girls Diastolic BP Percentile --      Boys Systolic BP Percentile --      Boys Diastolic BP Percentile --      Pulse Rate 01/13/24 1042 98     Resp 01/13/24 1042 16     Temp 01/13/24 1042 98.5 F (36.9 C)     Temp Source 01/13/24 1042 Oral     SpO2 01/13/24 1042 95 %     Weight --      Height --      Head Circumference --      Peak Flow --      Pain Score --      Pain Loc --      Pain Education --      Exclude from Growth Chart --    No data found.  Updated Vital Signs BP (!) 146/106   Pulse 98   Temp 98.5 F (36.9 C) (Oral)   Resp 16   SpO2 96%   Visual Acuity Right Eye Distance:   Left Eye Distance:   Bilateral Distance:    Right Eye Near:   Left Eye Near:    Bilateral Near:     Physical Exam Vitals and nursing note reviewed.  Constitutional:      General: He is awake. He is not in acute distress.    Appearance: Normal appearance. He is well-developed and well-groomed. He is not ill-appearing.  HENT:     Mouth/Throat:      Mouth: Angioedema present.     Pharynx: Oropharynx is clear.     Comments: Swelling noted to lower lip.  Without tongue or throat involvement Pulmonary:     Effort: Pulmonary effort is normal.  Breath sounds: Normal breath sounds.  Abdominal:     General: Bowel sounds are normal. There is no distension.     Palpations: Abdomen is soft.     Tenderness: There is abdominal tenderness in the left upper quadrant. There is no guarding or rebound.  Skin:    General: Skin is warm and dry.  Neurological:     Mental Status: He is alert.  Psychiatric:        Behavior: Behavior is cooperative.      UC Treatments / Results  Labs (all labs ordered are listed, but only abnormal results are displayed) Labs Reviewed  CBC  COMPREHENSIVE METABOLIC PANEL WITH GFR  LIPASE, BLOOD    EKG   Radiology No results found.  Procedures Procedures (including critical care time)  Medications Ordered in UC Medications  dexamethasone  (DECADRON ) injection 10 mg (10 mg Intramuscular Given 01/13/24 1132)    Initial Impression / Assessment and Plan / UC Course  I have reviewed the triage vital signs and the nursing notes.  Pertinent labs & imaging results that were available during my care of the patient were reviewed by me and considered in my medical decision making (see chart for details).     Patient is overall well-appearing.  Vitals are stable.  Blood pressure is elevated at 146/106.  Angioedema noted to lower lip.  Without tongue or throat involvement.  Lung sounds normal without stridor present.  Some tenderness noted to left upper quadrant.  Given IM Decadron  in clinic and prescribed prednisone burst to assist with lip swelling.  Prescribed cetirizine  for additional relief of this.  Ordered CBC, CMP, and lipase to reevaluate labs and ensure that continued pain is not related to worsening underlying illness.  Informed patient to discontinue lisinopril  and prescribed metoprolol for blood  pressure instead. Discussed follow-up, return, and strict ER precautions Final Clinical Impressions(s) / UC Diagnoses   Final diagnoses:  Angioedema due to angiotensin converting enzyme inhibitor (ACE-I)  Left upper quadrant abdominal pain     Discharge Instructions      You were given an injection of Decadron  in clinic today to help with your lip swelling. Start taking 2 tablets of prednisone once daily over the next 3 days for additional relief of this. Take 1 tablet of cetirizine  once daily for additional relief of this. Stop taking lisinopril  and start taking metoprolol for your blood pressure. We have drawn to basic lab work related to your abdominal pain.  These results will return over the next few days and if they are concerning someone will call and advise additional treatment at that time. Otherwise keep your follow-up appointment with [facility] on August 9. Call [facility] to schedule appointment and get established with a primary care provider for further management of your blood pressure. Return here as needed. If you develop increased lip swelling, tongue swelling, throat swelling, trouble breathing please seek immediate medical treatment in the emergency department.   ED Prescriptions     Medication Sig Dispense Auth. Provider   metoprolol succinate (TOPROL-XL) 25 MG 24 hr tablet Take 1 tablet (25 mg total) by mouth daily. 30 tablet [name] A, NP   cetirizine  (ZYRTEC  ALLERGY) 10 MG tablet Take 1 tablet (10 mg total) by mouth daily. 30 tablet [name] A, NP   predniSONE (DELTASONE) 20 MG tablet Take 2 tablets (40 mg total) by mouth daily for 3 days. 6 tablet [name] LABOR, NP  PDMP not reviewed this encounter.   [name] A, NP 01/13/24 1158

## 2024-01-13 NOTE — Telephone Encounter (Signed)
 Letter mailed

## 2024-01-13 NOTE — Discharge Instructions (Signed)
 You were given an injection of Decadron  in clinic today to help with your lip swelling. Start taking 2 tablets of prednisone once daily over the next 3 days for additional relief of this. Take 1 tablet of cetirizine  once daily for additional relief of this. Stop taking lisinopril  and start taking metoprolol for your blood pressure. We have drawn to basic lab work related to your abdominal pain.  These results will return over the next few days and if they are concerning someone will call and advise additional treatment at that time. Otherwise keep your follow-up appointment with [facility] on August 9. Call [facility] to schedule appointment and get established with a primary care provider for further management of your blood pressure. Return here as needed. If you develop increased lip swelling, tongue swelling, throat swelling, trouble breathing please seek immediate medical treatment in the emergency department.

## 2024-01-16 ENCOUNTER — Ambulatory Visit (HOSPITAL_COMMUNITY): Payer: Self-pay

## 2024-01-17 ENCOUNTER — Other Ambulatory Visit: Payer: Self-pay

## 2024-01-17 ENCOUNTER — Telehealth: Payer: Self-pay | Admitting: Nurse Practitioner

## 2024-01-17 MED ORDER — METRONIDAZOLE 250 MG PO TABS
250.0000 mg | ORAL_TABLET | Freq: Four times a day (QID) | ORAL | 0 refills | Status: DC
  Filled 2024-01-17: qty 56, 14d supply, fill #0

## 2024-01-17 MED ORDER — TETRACYCLINE HCL 500 MG PO CAPS
500.0000 mg | ORAL_CAPSULE | Freq: Four times a day (QID) | ORAL | 0 refills | Status: DC
  Filled 2024-01-17: qty 56, 14d supply, fill #0

## 2024-01-17 MED ORDER — BISMUTH SUBSALICYLATE 262 MG PO TABS
2.0000 | ORAL_TABLET | Freq: Four times a day (QID) | ORAL | 0 refills | Status: DC
  Filled 2024-01-17: qty 112, 14d supply, fill #0

## 2024-01-17 NOTE — Telephone Encounter (Signed)
 Copied from CRM [id number]. Topic: Appointments - Appointment Info/Confirmation >> Jan 16, 2024 12:39 PM Jorge [name] wrote: Patient/patient representative is calling for information regarding an appointment.   Pt received a letter to schedule a hosp follow up but it is booked out until [facility] August. He asking if someone can call him back to possibly get him in earlier.    ----------------------------------------------------------------------- From previous Reason for Contact - Scheduling: Patient/patient representative is calling to schedule an appointment. Refer to attachments for appointment information.  >> Jan 16, 2024  3:04 PM Yolanda T wrote: Patient called back and while on the phone he recvd a call from [facility]. He missed the call so not sure who or why they were calling. Please f/u with patient for the HFU.

## 2024-01-17 NOTE — Telephone Encounter (Signed)
 Patient called and stated that he was returning a call back to [place]. Patient is requesting a call back. Please advise.

## 2024-01-17 NOTE — Telephone Encounter (Addendum)
 Called patient and informed that our office is not currently accepting new patients. The patient was last seen in 2020. I provided the contact information for [facility], as that office is accepting new patients. Patient understood.

## 2024-01-18 ENCOUNTER — Other Ambulatory Visit (HOSPITAL_COMMUNITY): Payer: Self-pay

## 2024-01-18 ENCOUNTER — Other Ambulatory Visit: Payer: Self-pay

## 2024-01-18 MED ORDER — BISMUTH/METRONIDAZ/TETRACYCLIN 140-125-125 MG PO CAPS
ORAL_CAPSULE | ORAL | 0 refills | Status: DC
  Filled 2024-01-18: qty 120, 10d supply, fill #0

## 2024-01-23 ENCOUNTER — Other Ambulatory Visit: Payer: Self-pay

## 2024-01-23 ENCOUNTER — Other Ambulatory Visit (HOSPITAL_COMMUNITY): Payer: Self-pay

## 2024-01-23 ENCOUNTER — Telehealth (INDEPENDENT_AMBULATORY_CARE_PROVIDER_SITE_OTHER): Payer: Self-pay | Admitting: Primary Care

## 2024-01-23 NOTE — Telephone Encounter (Signed)
 Called pt to confirm appt. Pt will be present. I will arrange transportation for pt.

## 2024-01-24 ENCOUNTER — Encounter (INDEPENDENT_AMBULATORY_CARE_PROVIDER_SITE_OTHER): Payer: Self-pay | Admitting: Primary Care

## 2024-01-24 ENCOUNTER — Other Ambulatory Visit: Payer: Self-pay

## 2024-01-24 ENCOUNTER — Ambulatory Visit (INDEPENDENT_AMBULATORY_CARE_PROVIDER_SITE_OTHER): Admitting: Primary Care

## 2024-01-24 VITALS — BP 150/88 | HR 96 | Resp 16 | Wt 196.8 lb

## 2024-01-24 DIAGNOSIS — Z7984 Long term (current) use of oral hypoglycemic drugs: Secondary | ICD-10-CM

## 2024-01-24 DIAGNOSIS — E119 Type 2 diabetes mellitus without complications: Secondary | ICD-10-CM

## 2024-01-24 DIAGNOSIS — B9681 Helicobacter pylori [H. pylori] as the cause of diseases classified elsewhere: Secondary | ICD-10-CM

## 2024-01-24 DIAGNOSIS — J301 Allergic rhinitis due to pollen: Secondary | ICD-10-CM | POA: Diagnosis not present

## 2024-01-24 DIAGNOSIS — F17210 Nicotine dependence, cigarettes, uncomplicated: Secondary | ICD-10-CM

## 2024-01-24 DIAGNOSIS — K250 Acute gastric ulcer with hemorrhage: Secondary | ICD-10-CM

## 2024-01-24 DIAGNOSIS — K259 Gastric ulcer, unspecified as acute or chronic, without hemorrhage or perforation: Secondary | ICD-10-CM

## 2024-01-24 DIAGNOSIS — Z7689 Persons encountering health services in other specified circumstances: Secondary | ICD-10-CM

## 2024-01-24 DIAGNOSIS — Z720 Tobacco use: Secondary | ICD-10-CM

## 2024-01-24 DIAGNOSIS — I10 Essential (primary) hypertension: Secondary | ICD-10-CM | POA: Diagnosis not present

## 2024-01-24 DIAGNOSIS — E669 Obesity, unspecified: Secondary | ICD-10-CM

## 2024-01-24 DIAGNOSIS — Z6833 Body mass index (BMI) 33.0-33.9, adult: Secondary | ICD-10-CM

## 2024-01-24 MED ORDER — FLUTICASONE PROPIONATE 50 MCG/ACT NA SUSP
1.0000 | Freq: Every day | NASAL | 2 refills | Status: AC
  Filled 2024-01-24: qty 16, 60d supply, fill #0

## 2024-01-24 MED ORDER — FOLIC ACID 1 MG PO TABS
1.0000 mg | ORAL_TABLET | Freq: Every day | ORAL | 0 refills | Status: AC
  Filled 2024-01-24: qty 30, 30d supply, fill #0

## 2024-01-24 MED ORDER — CETIRIZINE HCL 10 MG PO TABS: 10.0000 mg | ORAL_TABLET | Freq: Every day | ORAL | 1 refills | Status: AC

## 2024-01-24 MED ORDER — BISMUTH/METRONIDAZ/TETRACYCLIN 140-125-125 MG PO CAPS: ORAL_CAPSULE | ORAL | 0 refills | Status: AC

## 2024-01-24 MED ORDER — AMLODIPINE BESYLATE 10 MG PO TABS
10.0000 mg | ORAL_TABLET | Freq: Every day | ORAL | 1 refills | Status: DC
  Filled 2024-01-24: qty 90, 90d supply, fill #0

## 2024-01-24 MED ORDER — ATORVASTATIN CALCIUM 40 MG PO TABS
40.0000 mg | ORAL_TABLET | Freq: Every day | ORAL | 1 refills | Status: DC
  Filled 2024-01-24 – 2024-02-23 (×2): qty 90, 90d supply, fill #0

## 2024-01-24 MED ORDER — THIAMINE HCL 100 MG PO TABS
100.0000 mg | ORAL_TABLET | Freq: Every day | ORAL | 0 refills | Status: AC
  Filled 2024-01-24: qty 30, 30d supply, fill #0

## 2024-01-24 MED ORDER — METFORMIN HCL 500 MG PO TABS
500.0000 mg | ORAL_TABLET | Freq: Two times a day (BID) | ORAL | 1 refills | Status: DC
  Filled 2024-01-24 – 2024-02-23 (×2): qty 180, 90d supply, fill #0

## 2024-01-24 NOTE — Progress Notes (Signed)
 Subjective:   Mr. Jorge [name] is a 58 y.o. male presents for  up and establish care.  Patient was seen in the urgent care and transferred to the 01/04/2024 hospitalized on left upper quadrant epigastric abdominal pain started abruptly, nausea and vomiting patient was discharged from the hospital on 01/13/24, patient was admitted for: Essential hypertension, type 2 diabetes A1c 6.7, nonseasonal allergic rhinitis due to pollen, nausea and vomiting and multiple gastric ulcers.  Today he is feeling a lot better however explained when he had his endoscopy he had to lose teeth on the bottom of his mouth.  When he woke up he had to missing teeth and that of his mouth awaiting a response from the hospital after issuing a complaint. Blood pressure remains elevated he was discharged on lisinopril  and had a anaphylactic reaction.  This  Past Medical History:  Diagnosis Date   Allergy    Diabetes mellitus without complication (HCC)    Environmental allergies    Hypertension    Sinus infection      Allergies  Allergen Reactions   Other Anaphylaxis    Allergy to dust and dirt if blow in front of his face causing swell of the eyes and short of breath    Peanut-Containing Drug Products Anaphylaxis    Current Outpatient Medications on File Prior to Visit  Medication Sig Dispense Refill   atorvastatin  (LIPITOR) 40 MG tablet Take 1 tablet (40 mg total) by mouth daily. 30 tablet 0   azelastine  (ASTELIN ) 0.1 % nasal spray Place 2 sprays into both nostrils 2 (two) times daily for 30 days. Use in each nostril as directed (Patient not taking: Reported on 01/04/2024) 30 mL 12   Bismuth /Metronidaz/Tetracyclin (PYLERA ) 140-125-125 MG CAPS Take as directed per package. 120 capsule 0   cetirizine  (ZYRTEC  ALLERGY) 10 MG tablet Take 1 tablet (10 mg total) by mouth daily. 30 tablet 0   Chlorpheniramine Maleate (ALLERGY RELIEF PO) Take 1 tablet by mouth daily as needed (allergies).     docusate sodium  (COLACE) 100 MG  capsule Take 1 capsule (100 mg total) by mouth 2 (two) times daily. 10 capsule 0   fluticasone  (FLONASE ) 50 MCG/ACT nasal spray Place 1 spray into both nostrils daily.     folic acid  (FOLVITE ) 1 MG tablet Take 1 tablet (1 mg total) by mouth daily. 30 tablet 0   lisinopril  (ZESTRIL ) 10 MG tablet Take 1 tablet (10 mg total) by mouth daily. 30 tablet 0   metFORMIN  (GLUCOPHAGE ) 500 MG tablet Take 1 tablet (500 mg total) by mouth 2 (two) times daily with a meal. 60 tablet 0   metoprolol  succinate (TOPROL -XL) 25 MG 24 hr tablet Take 1 tablet (25 mg total) by mouth daily. 30 tablet 1   ondansetron  (ZOFRAN ) 4 MG tablet Take 1 tablet (4 mg total) by mouth every 6 (six) hours as needed for nausea. 20 tablet 0   pantoprazole  (PROTONIX ) 40 MG tablet Take 1 tablet (40 mg total) by mouth 2 (two) times daily. 60 tablet 1   thiamine  (VITAMIN B1) 100 MG tablet Take 1 tablet (100 mg total) by mouth daily. 30 tablet 0   No current facility-administered medications on file prior to visit.    Review of System: ROS Comprehensive ROS Pertinent positive and negative noted in HPI   Objective:  BP (!) 153/95   Pulse 96   Resp 16   Wt 196 lb 12.8 oz (89.3 kg)   SpO2 97%   BMI 33.78 kg/m  Filed Weights   01/24/24 1500  Weight: 196 lb 12.8 oz (89.3 kg)    Physical Exam Vitals reviewed.  Constitutional:      Appearance: He is obese.  HENT:     Head: Normocephalic.     Right Ear: Tympanic membrane and ear canal normal.     Left Ear: Tympanic membrane and ear canal normal.     Ears:     Comments: Missing 2 top front teeth Ear canals red uses q tips    Nose: Congestion and rhinorrhea present.     Comments: red boggy swollen turbinates-using Afrin explained that was not a daily use medication has a rebound effect Eyes:     Extraocular Movements: Extraocular movements intact.     Pupils: Pupils are equal, round, and reactive to light.  Cardiovascular:     Rate and Rhythm: Normal rate and regular  rhythm.  Pulmonary:     Effort: Pulmonary effort is normal.     Breath sounds: Normal breath sounds.  Abdominal:     General: Bowel sounds are normal.     Palpations: Abdomen is soft.  Musculoskeletal:        General: Normal range of motion.  Skin:    General: Skin is warm and dry.  Neurological:     Mental Status: He is alert and oriented to person, place, and time.  Psychiatric:        Mood and Affect: Mood normal.        Behavior: Behavior normal.        Thought Content: Thought content normal.        Judgment: Judgment normal.      Assessment:  Sayf was seen today for hospitalization follow-up.  Diagnoses and all orders for this visit:  Encounter to establish care  Essential hypertension -     amLODipine  (NORVASC ) 10 MG tablet; Take 1 tablet (10 mg total) by mouth daily.  Multiple gastric ulcers -     Bismuth /Metronidaz/Tetracyclin (PYLERA ) 140-125-125 MG CAPS; Take as directed per package.  Type 2 diabetes mellitus without complication, without long-term current use of insulin  (HCC) -     metFORMIN  (GLUCOPHAGE ) 500 MG tablet; Take 1 tablet (500 mg total) by mouth 2 (two) times daily with a meal.  Obesity (BMI 30-39.9) Obesity is 30-39 indicating an excess in caloric intake or underlining conditions. This may lead to other co-morbidities. Educated on lifestyle modifications of diet and exercise which may reduce obesity.    Tobacco abuse - I have recommended complete cessation of tobacco use. I have discussed various options available for assistance with tobacco cessation including over the counter methods (Nicotine gum, patch and lozenges). We also discussed prescription options (Chantix, Nicotine Inhaler / Nasal Spray). The patient is not interested in pursuing any prescription tobacco cessation options at this time. - Patient declines at this time.  - Less than 5 minutes spent on counseling.  Acute ulcer of pylorus associated with Helicobacter pylori -      Bismuth /Metronidaz/Tetracyclin (PYLERA ) 140-125-125 MG CAPS; Take as directed per package.  Prediabetes -     metFORMIN  (GLUCOPHAGE ) 500 MG tablet; Take 1 tablet (500 mg total) by mouth 2 (two) times daily with a meal.  Seasonal allergic rhinitis due to pollen -     fluticasone  (FLONASE ) 50 MCG/ACT nasal spray; Place 1 spray into both nostrils daily. -     cetirizine  (ZYRTEC  ALLERGY) 10 MG tablet; Take 1 tablet (10 mg total) by mouth daily.  Other orders -  atorvastatin  (LIPITOR) 40 MG tablet; Take 1 tablet (40 mg total) by mouth daily. -     thiamine  (VITAMIN B1) 100 MG tablet; Take 1 tablet (100 mg total) by mouth daily. -     folic acid  (FOLVITE ) 1 MG tablet; Take 1 tablet (1 mg total) by mouth daily.     This note has been created with Education officer, environmental. Any transcriptional errors are unintentional.   No follow-ups on file.  Rosaline SHAUNNA Bohr, NP 01/24/2024, 3:26 PM

## 2024-01-25 ENCOUNTER — Other Ambulatory Visit: Payer: Self-pay

## 2024-01-26 ENCOUNTER — Other Ambulatory Visit: Payer: Self-pay

## 2024-01-27 ENCOUNTER — Other Ambulatory Visit (HOSPITAL_COMMUNITY): Payer: Self-pay

## 2024-01-27 ENCOUNTER — Other Ambulatory Visit: Payer: Self-pay

## 2024-02-17 ENCOUNTER — Telehealth (INDEPENDENT_AMBULATORY_CARE_PROVIDER_SITE_OTHER): Payer: Self-pay | Admitting: Primary Care

## 2024-02-17 NOTE — Telephone Encounter (Signed)
 Called to confirm appt. Pt will be present

## 2024-02-20 ENCOUNTER — Ambulatory Visit (INDEPENDENT_AMBULATORY_CARE_PROVIDER_SITE_OTHER): Admitting: Primary Care

## 2024-02-20 ENCOUNTER — Encounter (INDEPENDENT_AMBULATORY_CARE_PROVIDER_SITE_OTHER): Payer: Self-pay | Admitting: Primary Care

## 2024-02-20 VITALS — BP 134/98 | HR 102 | Temp 98.7°F | Resp 18 | Ht 64.0 in | Wt 191.2 lb

## 2024-02-20 DIAGNOSIS — I10 Essential (primary) hypertension: Secondary | ICD-10-CM

## 2024-02-20 NOTE — Progress Notes (Signed)
 [facility]   Jorge [name] is a 58 y.o. male presents for hypertension evaluation, Denies shortness of breath, headaches, chest pain or lower extremity edema, sudden onset, vision changes, unilateral weakness, dizziness, paresthesias.  Patient has been out of medication for the last 2 weeks.  Elevated blood pressure 143/104 will recheck  Patient denies adherence with medications.  Dietary habits include: Monitoring sodium intake decreasing processed foods for processed Exercise habits include: walks  Family / Social history: HTN maternal fraternal HTN, T2D   Past Medical History:  Diagnosis Date   Allergy    Diabetes mellitus without complication (HCC)    Environmental allergies    Hypertension    Sinus infection    Past Surgical History:  Procedure Laterality Date   ESOPHAGOGASTRODUODENOSCOPY N/A 01/05/2024   Procedure: EGD (ESOPHAGOGASTRODUODENOSCOPY);  Surgeon: [name], MD;  Location: [facility] ENDOSCOPY;  Service: Gastroenterology;  Laterality: N/A;   NO PAST SURGERIES     Allergies  Allergen Reactions   Other Anaphylaxis    Allergy to dust and dirt if blow in front of his face causing swell of the eyes and short of breath    Peanut-Containing Drug Products Anaphylaxis   Current Outpatient Medications on File Prior to Visit  Medication Sig Dispense Refill   amLODipine  (NORVASC ) 10 MG tablet Take 1 tablet (10 mg total) by mouth daily. 90 tablet 1   atorvastatin  (LIPITOR) 40 MG tablet Take 1 tablet (40 mg total) by mouth daily. 90 tablet 1   Bismuth /Metronidaz/Tetracyclin (PYLERA ) 140-125-125 MG CAPS Take as directed per package. 120 capsule 0   cetirizine  (ZYRTEC  ALLERGY) 10 MG tablet Take 1 tablet (10 mg total) by mouth daily. 90 tablet 1   Chlorpheniramine Maleate (ALLERGY RELIEF PO) Take 1 tablet by mouth daily as needed (allergies).     docusate sodium  (COLACE) 100 MG capsule Take 1 capsule (100 mg total) by mouth 2 (two) times daily. 10 capsule  0   fluticasone  (FLONASE ) 50 MCG/ACT nasal spray Place 1 spray into both nostrils daily. 18.2 g 2   folic acid  (FOLVITE ) 1 MG tablet Take 1 tablet (1 mg total) by mouth daily. 30 tablet 0   metFORMIN  (GLUCOPHAGE ) 500 MG tablet Take 1 tablet (500 mg total) by mouth 2 (two) times daily with a meal. 180 tablet 1   ondansetron  (ZOFRAN ) 4 MG tablet Take 1 tablet (4 mg total) by mouth every 6 (six) hours as needed for nausea. 20 tablet 0   thiamine  (VITAMIN B1) 100 MG tablet Take 1 tablet (100 mg total) by mouth daily. 30 tablet 0   azelastine  (ASTELIN ) 0.1 % nasal spray Place 2 sprays into both nostrils 2 (two) times daily for 30 days. Use in each nostril as directed (Patient not taking: Reported on 01/04/2024) 30 mL 12   No current facility-administered medications on file prior to visit.   Social History   Socioeconomic History   Marital status: Single    Spouse name: Not on file   Number of children: Not on file   Years of education: Not on file   Highest education level: Not on file  Occupational History   Not on file  Tobacco Use   Smoking status: Every Day    Current packs/day: 0.50    Types: Cigarettes   Smokeless tobacco: Never  Vaping Use   Vaping status: Never Used  Substance and Sexual Activity   Alcohol use: Not Currently   Drug use: Yes    Types: Marijuana  Comment: daily per pt last 12/05/16   Sexual activity: Yes  Other Topics Concern   Not on file  Social History Narrative   Not on file   Social Drivers of Health   Financial Resource Strain: Medium Risk (02/20/2024)   Overall Financial Resource Strain (CARDIA)    Difficulty of Paying Living Expenses: Somewhat hard  Food Insecurity: No Food Insecurity (01/05/2024)   Hunger Vital Sign    Worried About Running Out of Food in the Last Year: Never true    Ran Out of Food in the Last Year: Never true  Transportation Needs: No Transportation Needs (01/05/2024)   PRAPARE - Administrator, Civil Service  (Medical): No    Lack of Transportation (Non-Medical): No  Physical Activity: Inactive (02/20/2024)   Exercise Vital Sign    Days of Exercise per Week: 0 days    Minutes of Exercise per Session: 0 min  Stress: No Stress Concern Present (02/20/2024)   [employer] of Occupational Health - Occupational Stress Questionnaire    Feeling of Stress: Not at all  Social Connections: Socially Integrated (02/20/2024)   Social Connection and Isolation Panel    Frequency of Communication with Friends and Family: More than three times a week    Frequency of Social Gatherings with Friends and Family: More than three times a week    Attends Religious Services: More than 4 times per year    Active Member of Clubs or Organizations: Yes    Attends Banker Meetings: More than 4 times per year    Marital Status: Married  Catering manager Violence: Not At Risk (01/05/2024)   Humiliation, Afraid, Rape, and Kick questionnaire    Fear of Current or Ex-Partner: No    Emotionally Abused: No    Physically Abused: No    Sexually Abused: No   Family History  Problem Relation Age of Onset   Ovarian cancer Mother    Diabetes Father    Colon cancer Neg Hx    Health Maintenance  Topic Date Due   Hepatitis C Screening  Never done   Hepatitis B Vaccines 19-59 Average Risk (1 of 3 - 19+ 3-dose series) Never done   Lung Cancer Screening  Never done   Zoster Vaccines- Shingrix (1 of 2) Never done   Pneumococcal Vaccine: 50+ Years (2 of 2 - PCV) 03/15/2017   Diabetic kidney evaluation - Urine ACR  10/04/2017   COVID-19 Vaccine (1 - 2024-25 season) Never done   INFLUENZA VACCINE  01/27/2024   Diabetic kidney evaluation - eGFR measurement  01/12/2025   DTaP/Tdap/Td (2 - Td or Tdap) 03/15/2026   Colonoscopy  12/08/2026   HIV Screening  Completed   HPV VACCINES  Aged Out   Meningococcal B Vaccine  Aged Out     OBJECTIVE:  Vitals:   02/20/24 1030 02/20/24 1041  BP: (!) 135/91 (!) 143/104   Pulse: (!) 102   Resp: 18   Temp: 98.7 F (37.1 C)   TempSrc: Oral   SpO2: 98%   Weight: 191 lb 3.2 oz (86.7 kg)   Height: 5' 4 (1.626 m)     Physical Exam Vitals reviewed.  Constitutional:      Appearance: He is obese.  HENT:     Head: Normocephalic.     Right Ear: Tympanic membrane and external ear normal.     Left Ear: Tympanic membrane and external ear normal.     Nose: Nose normal.  Eyes:  Extraocular Movements: Extraocular movements intact.     Pupils: Pupils are equal, round, and reactive to light.  Cardiovascular:     Rate and Rhythm: Normal rate and regular rhythm.  Pulmonary:     Effort: Pulmonary effort is normal.     Breath sounds: Normal breath sounds.  Abdominal:     General: Bowel sounds are normal. There is distension.     Palpations: Abdomen is soft.  Musculoskeletal:        General: Normal range of motion.  Skin:    General: Skin is warm and dry.  Neurological:     Mental Status: He is oriented to person, place, and time.  Psychiatric:        Mood and Affect: Mood normal.        Behavior: Behavior normal.        Thought Content: Thought content normal.        Judgment: Judgment normal.      Review of Systems  All other systems reviewed and are negative.   Last 3 Office BP readings: BP Readings from Last 3 Encounters:  02/20/24 (!) 143/104  01/24/24 (!) 150/88  01/13/24 (!) 146/106    BMET    Component Value Date/Time   NA 138 01/13/2024 1135   NA 145 (H) 10/02/2018 1156   K 4.8 01/13/2024 1135   CL 103 01/13/2024 1135   CO2 26 01/13/2024 1135   GLUCOSE 94 01/13/2024 1135   BUN 18 01/13/2024 1135   BUN 10 10/02/2018 1156   CREATININE 1.35 (H) 01/13/2024 1135   CREATININE 1.13 03/15/2016 1639   CALCIUM  9.4 01/13/2024 1135   GFRNONAA >60 01/13/2024 1135   GFRNONAA 75 03/15/2016 1639   GFRAA 78 10/02/2018 1156   GFRAA 87 03/15/2016 1639    Renal function: CrCl cannot be calculated (Patient's most recent lab result is  older than the maximum 21 days allowed.).  Clinical ASCVD: No  The ASCVD Risk score (Arnett DK, et al., 2019) failed to calculate for the following reasons:   Cannot find a previous HDL lab   Cannot find a previous total cholesterol lab  ASCVD risk factors include- ITALY   ASSESSMENT & PLAN:  Mahlik was seen today for follow-up.  Diagnoses and all orders for this visit:  Essential hypertension -Counseled on lifestyle modifications for blood pressure control including reduced dietary sodium, increased exercise, weight reduction and adequate sleep. Also, educated patient about the risk for cardiovascular events, stroke and heart attack. Also counseled patient about the importance of medication adherence. If you participate in smoking, it is important to stop using tobacco as this will increase the risks associated with uncontrolled blood pressure.   Goal BP:  For patients younger than 60: Goal BP < 130/80. For patients 60 and older: Goal BP < 140/90. For patients with diabetes: Goal BP < 130/80. Your most recent BP: 134/98  Minimize salt intake. Minimize alcohol intake    This note has been created with Education officer, environmental. Any transcriptional errors are unintentional.   Rosaline SHAUNNA Bohr, NP 02/20/2024, 11:03 AM

## 2024-02-23 ENCOUNTER — Other Ambulatory Visit: Payer: Self-pay

## 2024-02-29 ENCOUNTER — Ambulatory Visit: Payer: Self-pay

## 2024-02-29 NOTE — Progress Notes (Deleted)
 [name], PA-C [address] [place], [place]  [address] Phone: [phone]   Primary Care Physician: Celestia Rosaline SQUIBB, NP  Primary Gastroenterologist:  [name], PA-C / Dr. Gordy Starch   Chief Complaint: Hospital follow-up gastric ulcer       HPI:   Jorge [name] is a 58 y.o. male, established patient Dr. Starch, presents for hospital follow-up of gastric ulcer.  He was hospitalized 01/04/2024 until 01/06/2024 for acute nausea, vomiting, gastric ulcers, esophagitis, H. pylori infection.  Acute nausea vomiting occurred after he ate at a cookout and was thought due to food poisoning.  Abdominal pelvic CT showed diffuse wall thickening and narrowing in the distal stomach.  Was started on pantoprazole  40 Mg twice daily.  During hospitalization he had acute kidney injury from dehydration and leukocytosis which improved.  Normal hemoglobin between 17 and 15 g.  01/05/2024 EGD: LA grade B esophagitis.  5 nonbleeding cratered gastric ulcers in the gastric antrum.  Largest ulcer 15 mm.  Moderate duodenitis.  Biopsies showed chronic active gastritis and were positive for H. pylori.  Treated with metronidazole , tetracycline , PPI, and bismuth  subsalicylate x 14 days.  Current symptoms:    11/2016 screening colonoscopy by Dr. Starch: 2 small (3 mm to 4 mm) hyperplastic and prolapse rectal polyps removed.  Left-sided diverticulosis.  Small internal hemorrhoids.  Good prep.  10-year repeat (due 11/2026).  PMH: HTN, diabetes type 2, history of alcohol abuse, history of alcoholic pancreatitis, history of gastric ulcers, history of marijuana use, GERD with esophagitis,.  Current Outpatient Medications  Medication Sig Dispense Refill   amLODipine  (NORVASC ) 10 MG tablet Take 1 tablet (10 mg total) by mouth daily. 90 tablet 1   atorvastatin  (LIPITOR) 40 MG tablet Take 1 tablet (40 mg total) by mouth daily. 90 tablet 1   azelastine  (ASTELIN ) 0.1 % nasal spray Place 2 sprays into both  nostrils 2 (two) times daily for 30 days. Use in each nostril as directed (Patient not taking: Reported on 01/04/2024) 30 mL 12   Bismuth /Metronidaz/Tetracyclin (PYLERA ) 140-125-125 MG CAPS Take as directed per package. 120 capsule 0   cetirizine  (ZYRTEC  ALLERGY) 10 MG tablet Take 1 tablet (10 mg total) by mouth daily. 90 tablet 1   Chlorpheniramine Maleate (ALLERGY RELIEF PO) Take 1 tablet by mouth daily as needed (allergies).     docusate sodium  (COLACE) 100 MG capsule Take 1 capsule (100 mg total) by mouth 2 (two) times daily. 10 capsule 0   fluticasone  (FLONASE ) 50 MCG/ACT nasal spray Place 1 spray into both nostrils daily. 18.2 g 2   folic acid  (FOLVITE ) 1 MG tablet Take 1 tablet (1 mg total) by mouth daily. 30 tablet 0   metFORMIN  (GLUCOPHAGE ) 500 MG tablet Take 1 tablet (500 mg total) by mouth 2 (two) times daily with a meal. 180 tablet 1   ondansetron  (ZOFRAN ) 4 MG tablet Take 1 tablet (4 mg total) by mouth every 6 (six) hours as needed for nausea. 20 tablet 0   thiamine  (VITAMIN B1) 100 MG tablet Take 1 tablet (100 mg total) by mouth daily. 30 tablet 0   No current facility-administered medications for this visit.    Allergies as of 03/01/2024 - Review Complete 02/20/2024  Allergen Reaction Noted   Other Anaphylaxis 11/24/2016   Peanut-containing drug products Anaphylaxis 11/24/2016    Past Medical History:  Diagnosis Date   Allergy    Diabetes mellitus without complication (HCC)    Environmental allergies  Hypertension    Sinus infection     Past Surgical History:  Procedure Laterality Date   ESOPHAGOGASTRODUODENOSCOPY N/A 01/05/2024   Procedure: EGD (ESOPHAGOGASTRODUODENOSCOPY);  Surgeon: [name], MD;  Location: [facility] ENDOSCOPY;  Service: Gastroenterology;  Laterality: N/A;   NO PAST SURGERIES      Review of Systems:    All systems reviewed and negative except where noted in HPI.    Physical Exam:  There were no vitals taken for this visit. No LMP for male  patient.  General: Well-nourished, well-developed in no acute distress.  Lungs: Clear to auscultation bilaterally. Non-labored. Heart: Regular rate and rhythm, no murmurs rubs or gallops.  Abdomen: Bowel sounds are normal; Abdomen is Soft; No hepatosplenomegaly, masses or hernias;  No Abdominal Tenderness; No guarding or rebound tenderness. Neuro: Alert and oriented x 3.  Grossly intact.  Psych: Alert and cooperative, normal mood and affect.   Imaging Studies: No results found.  Labs: CBC    Component Value Date/Time   WBC 12.3 (H) 01/13/2024 1135   RBC 5.02 01/13/2024 1135   HGB 15.4 01/13/2024 1135   HGB 15.1 10/02/2018 1156   HCT 46.1 01/13/2024 1135   HCT 45.0 10/02/2018 1156   PLT 295 01/13/2024 1135   PLT 228 10/02/2018 1156   MCV 91.8 01/13/2024 1135   MCV 91 10/02/2018 1156   MCH 30.7 01/13/2024 1135   MCHC 33.4 01/13/2024 1135   RDW 14.5 01/13/2024 1135   RDW 14.4 10/02/2018 1156   LYMPHSABS 2.6 01/06/2024 0605   LYMPHSABS 4.2 (H) 10/04/2016 1500   MONOABS 1.3 (H) 01/06/2024 0605   EOSABS 0.5 01/06/2024 0605   EOSABS 0.2 10/04/2016 1500   BASOSABS 0.1 01/06/2024 0605   BASOSABS 0.0 10/04/2016 1500    CMP     Component Value Date/Time   NA 138 01/13/2024 1135   NA 145 (H) 10/02/2018 1156   K 4.8 01/13/2024 1135   CL 103 01/13/2024 1135   CO2 26 01/13/2024 1135   GLUCOSE 94 01/13/2024 1135   BUN 18 01/13/2024 1135   BUN 10 10/02/2018 1156   CREATININE 1.35 (H) 01/13/2024 1135   CREATININE 1.13 03/15/2016 1639   CALCIUM  9.4 01/13/2024 1135   PROT 7.3 01/13/2024 1135   ALBUMIN 3.6 01/13/2024 1135   AST 23 01/13/2024 1135   ALT 34 01/13/2024 1135   ALKPHOS 60 01/13/2024 1135   BILITOT 0.4 01/13/2024 1135   GFRNONAA >60 01/13/2024 1135   GFRNONAA 75 03/15/2016 1639   GFRAA 78 10/02/2018 1156   GFRAA 87 03/15/2016 1639       Assessment and Plan:   Jorge [name] is a 58 y.o. y/o male y/o male  1.  GERD with esophagitis  2.  Gastric ulcers  3.  H.  pylori infection treated with quadruple therapy 01/12/2024  4.  Duodenitis     [name], PA-C  Follow up ***

## 2024-02-29 NOTE — Telephone Encounter (Signed)
 FYI Only or Action Required?: FYI only for provider.  Patient was last seen in primary care on 02/20/2024 by Celestia Rosaline SQUIBB, NP.  Called Nurse Triage reporting Dental Pain.  Symptoms began several days ago.  Interventions attempted: Nothing.  Symptoms are: gradually worsening.  Triage Disposition: Call Dentist When Office is Open  Patient/caregiver understands and will follow disposition?: Yes     Copied from CRM [id number]. Topic: Clinical - Red Word Triage >> Feb 29, 2024  2:40 PM Turkey B wrote: Patient has tooth pain and swollen Reason for Disposition  Toothache present > 24 hours  Answer Assessment - Initial Assessment Questions 1. LOCATION: Which tooth is hurting?  (e.g., right-side/left-side, upper/lower, front/back)     Bottom right molar 2. ONSET: When did the toothache start?  (e.g., hours, days)      Two days ago 3. SEVERITY: How bad is the toothache?  (Scale 1-10; mild, moderate or severe)     moderate 4. SWELLING: Is there any visible swelling of your face?     yes 5. OTHER SYMPTOMS: Do you have any other symptoms? (e.g., fever)     denies 6. PREGNANCY: Is there any chance you are pregnant? When was your last menstrual period?     na  Protocols used: Toothache-A-AH

## 2024-03-01 ENCOUNTER — Ambulatory Visit: Payer: Self-pay | Admitting: Physician Assistant

## 2024-03-02 ENCOUNTER — Ambulatory Visit: Payer: Self-pay | Admitting: Internal Medicine

## 2024-03-09 ENCOUNTER — Encounter (INDEPENDENT_AMBULATORY_CARE_PROVIDER_SITE_OTHER): Payer: Self-pay | Admitting: Primary Care

## 2024-03-09 ENCOUNTER — Ambulatory Visit (INDEPENDENT_AMBULATORY_CARE_PROVIDER_SITE_OTHER): Admitting: Primary Care

## 2024-03-09 ENCOUNTER — Other Ambulatory Visit: Payer: Self-pay

## 2024-03-09 VITALS — BP 136/89

## 2024-03-09 DIAGNOSIS — G4733 Obstructive sleep apnea (adult) (pediatric): Secondary | ICD-10-CM

## 2024-03-09 DIAGNOSIS — Z0130 Encounter for examination of blood pressure without abnormal findings: Secondary | ICD-10-CM | POA: Diagnosis not present

## 2024-03-09 DIAGNOSIS — N529 Male erectile dysfunction, unspecified: Secondary | ICD-10-CM

## 2024-03-09 DIAGNOSIS — Z23 Encounter for immunization: Secondary | ICD-10-CM | POA: Diagnosis not present

## 2024-03-09 DIAGNOSIS — I10 Essential (primary) hypertension: Secondary | ICD-10-CM | POA: Diagnosis not present

## 2024-03-09 MED ORDER — TADALAFIL 5 MG PO TABS
5.0000 mg | ORAL_TABLET | Freq: Every day | ORAL | 1 refills | Status: AC
  Filled 2024-03-09: qty 30, 30d supply, fill #0
  Filled 2024-06-05: qty 30, 30d supply, fill #1

## 2024-03-09 NOTE — Progress Notes (Signed)
 [facility]  Jorge [name], is a 58 y.o. male  [id number]  FMW:[id number]  DOB - May 26, 1966  Chief Complaint  Patient presents with   Hypertension   sleep test     Pt request       Subjective:   Jorge [name] is a 58 y.o. male here today for a follow up visit. Patient has No headache, No chest pain, No abdominal pain - No Nausea, No new weakness tingling or numbness, No Cough - shortness of breath Patient presents with possible obstructive sleep apnea. Patent has a several years history of symptoms of daytime fatigue, morning fatigue, morning headache, and hypertension. Patient generally gets 3 or 4 hours of sleep per night, and states they generally have nightime awakenings and difficulty falling back asleep if awakened. Snoring of severe severity is present. Apneic episodes is present. Nasal obstruction is not present.  Patient has not had tonsillectomy.    No problems updated.  Comprehensive ROS Pertinent positive and negative noted in HPI   Allergies  Allergen Reactions   Other Anaphylaxis    Allergy to dust and dirt if blow in front of his face causing swell of the eyes and short of breath    Peanut-Containing Drug Products Anaphylaxis    Past Medical History:  Diagnosis Date   Allergy    Diabetes mellitus without complication (HCC)    Environmental allergies    Hypertension    Sinus infection     Current Outpatient Medications on File Prior to Visit  Medication Sig Dispense Refill   amLODipine  (NORVASC ) 10 MG tablet Take 1 tablet (10 mg total) by mouth daily. 90 tablet 1   atorvastatin  (LIPITOR) 40 MG tablet Take 1 tablet (40 mg total) by mouth daily. 90 tablet 1   Bismuth /Metronidaz/Tetracyclin (PYLERA ) 140-125-125 MG CAPS Take as directed per package. 120 capsule 0   cetirizine  (ZYRTEC  ALLERGY) 10 MG tablet Take 1 tablet (10 mg total) by mouth daily. 90 tablet 1   Chlorpheniramine Maleate (ALLERGY RELIEF PO) Take 1 tablet by mouth daily  as needed (allergies).     docusate sodium  (COLACE) 100 MG capsule Take 1 capsule (100 mg total) by mouth 2 (two) times daily. 10 capsule 0   fluticasone  (FLONASE ) 50 MCG/ACT nasal spray Place 1 spray into both nostrils daily. 18.2 g 2   folic acid  (FOLVITE ) 1 MG tablet Take 1 tablet (1 mg total) by mouth daily. 30 tablet 0   metFORMIN  (GLUCOPHAGE ) 500 MG tablet Take 1 tablet (500 mg total) by mouth 2 (two) times daily with a meal. 180 tablet 1   ondansetron  (ZOFRAN ) 4 MG tablet Take 1 tablet (4 mg total) by mouth every 6 (six) hours as needed for nausea. 20 tablet 0   thiamine  (VITAMIN B1) 100 MG tablet Take 1 tablet (100 mg total) by mouth daily. 30 tablet 0   azelastine  (ASTELIN ) 0.1 % nasal spray Place 2 sprays into both nostrils 2 (two) times daily for 30 days. Use in each nostril as directed (Patient not taking: Reported on 01/04/2024) 30 mL 12   No current facility-administered medications on file prior to visit.   Health Maintenance  Topic Date Due   Hepatitis C Screening  Never done   Hepatitis B Vaccine (1 of 3 - 19+ 3-dose series) Never done   Screening for Lung Cancer  Never done   Zoster (Shingles) Vaccine (1 of 2) Never done   Pneumococcal Vaccine for age over 17 (2 of 2 - PCV) 03/15/2017  Yearly kidney health urinalysis for diabetes  10/04/2017   Flu Shot  01/27/2024   COVID-19 Vaccine (1 - 2024-25 season) Never done   Yearly kidney function blood test for diabetes  01/12/2025   DTaP/Tdap/Td vaccine (2 - Td or Tdap) 03/15/2026   Colon Cancer Screening  12/08/2026   HIV Screening  Completed   HPV Vaccine  Aged Out   Meningitis B Vaccine  Aged Out    Objective:   Vitals:   03/09/24 1116  BP: 136/89   BP Readings from Last 3 Encounters:  03/09/24 136/89  02/20/24 (!) 134/98  01/24/24 (!) 150/88      Physical Exam Vitals reviewed.  Constitutional:      Appearance: He is obese.  HENT:     Head: Normocephalic.     Right Ear: Tympanic membrane and external ear  normal.     Left Ear: Tympanic membrane and external ear normal.     Nose: Nose normal.  Eyes:     Extraocular Movements: Extraocular movements intact.  Cardiovascular:     Rate and Rhythm: Normal rate and regular rhythm.  Pulmonary:     Effort: Pulmonary effort is normal.     Breath sounds: Normal breath sounds.  Abdominal:     General: Bowel sounds are normal. There is distension.     Palpations: Abdomen is soft.  Musculoskeletal:        General: Normal range of motion.  Skin:    General: Skin is warm and dry.  Neurological:     Mental Status: He is oriented to person, place, and time.  Psychiatric:        Mood and Affect: Mood normal.        Behavior: Behavior normal.        Thought Content: Thought content normal.        Judgment: Judgment normal.     Assessment & Plan  Jorge [name] was seen today for hypertension and sleep test .  Diagnoses and all orders for this visit:  Blood pressure check 2/2 Essential hypertension BP goal - < 130/80 Explained that having normal blood pressure is the goal and medications are helping to get to goal and maintain normal blood pressure. DIET: Limit salt intake, read nutrition labels to check salt content, limit fried and high fatty foods  Avoid using multisymptom OTC cold preparations that generally contain sudafed which can rise BP. Consult with pharmacist on best cold relief products to use for persons with HTN EXERCISE Discussed incorporating exercise such as walking - 30 minutes most days of the week and can do in 10 minute intervals      OSA (obstructive sleep apnea)  Erectile dysfunction, unspecified erectile dysfunction type   Other orders -     tadalafil  (CIALIS ) 5 MG tablet; Take 1 tablet (5 mg total) by mouth daily.     Patient have been counseled extensively about nutrition and exercise. Other issues discussed during this visit include: low cholesterol diet, weight control and daily exercise, foot care, annual eye  examinations at Ophthalmology, importance of adherence with medications and regular follow-up. We also discussed long term complications of uncontrolled diabetes and hypertension.   Return in about 3 months (around 06/08/2024) for fasting labs, re-check blood pressure.  The patient was given clear instructions to go to ER or return to medical center if symptoms don't improve, worsen or new problems develop. The patient verbalized understanding. The patient was told to call to get lab results if they haven't heard  anything in the next week.   This note has been created with Education officer, environmental. Any transcriptional errors are unintentional.   Jorge SHAUNNA Bohr, NP 03/09/2024, 11:37 AM

## 2024-03-09 NOTE — Addendum Note (Signed)
 Addended by: Georgean Spainhower I on: 03/09/2024 11:47 AM   Modules accepted: Orders

## 2024-03-15 ENCOUNTER — Other Ambulatory Visit: Payer: Self-pay

## 2024-03-22 ENCOUNTER — Encounter: Payer: Self-pay | Admitting: Pulmonary Disease

## 2024-04-30 NOTE — Progress Notes (Signed)
 "       Jorge Console, PA-C [address] [place], [place]  [address] Phone: [phone]   Primary Care Physician: Jorge Rosaline SQUIBB, NP  Primary Gastroenterologist:  Jorge Console, PA-C / Dr. Gordy [name]   Chief Complaint: Hospital follow-up gastric ulces and H. pylori gastritis       HPI:   Discussed the use of AI scribe software for clinical note transcription with the patient, who gave verbal consent to proceed.  Jorge [name] is a 58 year old male with a history of stomach ulcers and H. pylori infection who presents for hospital follow-up.  He was hospitalized 01/04/2024 until 01/06/2024. Presented to hospital with 4 day history of intractable nausea, vomiting, abdominal pain, abnormal CT of the stomach.  EGD showed multiple gastric ulcers.  biopsies positive for H. Pylori with chronic active gastritis.  He was advised to avoid NSAIDs and aspirin.  Treated with pantoprazole  40 mg twice daily.  Initial labs 7/9 showed mild elevated lipase 59, Hgb 17.8, WBC 19.3.  EGD biopsies returned positive for H. pylori and chronic active gastritis on 01/12/2024.  Patient was started on metronidazole , bismuth  subsalicylate, tetracycline , and omeprazole x 14 days.  He completed all antibiotics.  He has not had any more nausea, vomiting, or upper abdominal pain since July.  He stopped taking OTC NSAIDs.  Prior to hospital admission he was taking a lot of. ibuprofen  daily for knee pain.  01/05/2024 EGD by Dr. Legrand in hospital: LA grade B esophagitis.  5 nonbleeding cratered gastric ulcers, largest 15 mm.  Diffuse moderate duodenitis.  11/2016 colonoscopy by Dr. Starch: 2 small (3 mm to 4 mm) hyperplastic rectal polyps removed.  Moderate diverticulosis.  Small internal hemorrhoids.  Good prep.  10-year repeat screening colonoscopy (due 11/2026).  01/04/24 CT Abd / Pelvis with Contrast: 1. Abnormal appearance of the distal stomach with diffuse wall thickening and narrowing. Findings are nonspecific.  Findings could represent gastritis but a neoplastic process cannot be excluded. Recommend GI consultation. 2. No other acute abnormality in the abdomen or pelvis. 3. Colonic diverticulosis without acute inflammation. 4. Aortic Atherosclerosis History of Present Illness He occasionally experiences mild left-sided abdominal pain, rated as a 5 out of 10, primarily during periods of stress or after overeating. No heartburn is present, and he describes himself as having an 'ironclad stomach,' capable of eating almost anything.  He has a history of frequent ibuprofen  use for leg pain, taking up to four tablets six times a day, which he believes contributed to his stomach issues. He has since stopped using ibuprofen  and switched to Tylenol , taking no more than four tablets a week.  He is not currently taking pantoprazole  or any other medications prescribed during his hospital stay.  He has a history of pancreatic issues related to past alcohol use.  Last drink of alcohol was several years ago.  He has made dietary changes, reducing his intake of red meat and spicy foods, and has intentional weight loss from a previous weight of 220-230 pounds.    PMH:  Medical history significant for  hypertension, diabetes mellitus type 2, history of alcohol induced pancreatitis, gastric ulcers, and H. pylori infection.    Current Outpatient Medications  Medication Sig Dispense Refill   amLODipine  (NORVASC ) 10 MG tablet Take 1 tablet (10 mg total) by mouth daily. 90 tablet 1   atorvastatin  (LIPITOR) 40 MG tablet Take 1 tablet (40 mg total) by mouth daily. 90 tablet 1   azelastine  (ASTELIN ) 0.1 %  nasal spray Place 2 sprays into both nostrils 2 (two) times daily for 30 days. Use in each nostril as directed 30 mL 12   Bismuth /Metronidaz/Tetracyclin (PYLERA ) 140-125-125 MG CAPS Take as directed per package. 120 capsule 0   cetirizine  (ZYRTEC  ALLERGY) 10 MG tablet Take 1 tablet (10 mg total) by mouth daily. 90 tablet 1    Chlorpheniramine Maleate (ALLERGY RELIEF PO) Take 1 tablet by mouth daily as needed (allergies).     docusate sodium  (COLACE) 100 MG capsule Take 1 capsule (100 mg total) by mouth 2 (two) times daily. 10 capsule 0   fluticasone  (FLONASE ) 50 MCG/ACT nasal spray Place 1 spray into both nostrils daily. 18.2 g 2   folic acid  (FOLVITE ) 1 MG tablet Take 1 tablet (1 mg total) by mouth daily. 30 tablet 0   metFORMIN  (GLUCOPHAGE ) 500 MG tablet Take 1 tablet (500 mg total) by mouth 2 (two) times daily with a meal. 180 tablet 1   ondansetron  (ZOFRAN ) 4 MG tablet Take 1 tablet (4 mg total) by mouth every 6 (six) hours as needed for nausea. 20 tablet 0   tadalafil  (CIALIS ) 5 MG tablet Take 1 tablet (5 mg total) by mouth daily. 30 tablet 1   thiamine  (VITAMIN B1) 100 MG tablet Take 1 tablet (100 mg total) by mouth daily. 30 tablet 0   No current facility-administered medications for this visit.    Allergies as of 05/01/2024 - Review Complete 05/01/2024  Allergen Reaction Noted   Other Anaphylaxis 11/24/2016   Peanut-containing drug products Anaphylaxis 11/24/2016    Past Medical History:  Diagnosis Date   Allergy    Diabetes mellitus without complication (HCC)    Environmental allergies    Hypertension    Sinus infection     Past Surgical History:  Procedure Laterality Date   ESOPHAGOGASTRODUODENOSCOPY N/A 01/05/2024   Procedure: EGD (ESOPHAGOGASTRODUODENOSCOPY);  Surgeon: Jorge Victory LITTIE DOUGLAS, MD;  Location: [facility] ENDOSCOPY;  Service: Gastroenterology;  Laterality: N/A;   NO PAST SURGERIES      Review of Systems:    All systems reviewed and negative except where noted in HPI.    Physical Exam:  BP 120/66   Pulse 64   Ht 5' 6 (1.676 m)   Wt 195 lb (88.5 kg)   BMI 31.47 kg/m  No LMP for male patient.  General: Well-nourished, well-developed in no acute distress.  Lungs: Clear to auscultation bilaterally. Non-labored. Heart: Regular rate and rhythm, no murmurs rubs or gallops.   Abdomen: Bowel sounds are normal; Abdomen is Soft; No hepatosplenomegaly, masses or hernias;  No Abdominal Tenderness; No guarding or rebound tenderness. Neuro: Alert and oriented x 3.  Grossly intact.  Psych: Alert and cooperative, normal mood and affect.   Imaging Studies: No results found.  Labs: CBC    Component Value Date/Time   WBC 12.3 (H) 01/13/2024 1135   RBC 5.02 01/13/2024 1135   HGB 15.4 01/13/2024 1135   HGB 15.1 10/02/2018 1156   HCT 46.1 01/13/2024 1135   HCT 45.0 10/02/2018 1156   PLT 295 01/13/2024 1135   PLT 228 10/02/2018 1156   MCV 91.8 01/13/2024 1135   MCV 91 10/02/2018 1156   MCH 30.7 01/13/2024 1135   MCHC 33.4 01/13/2024 1135   RDW 14.5 01/13/2024 1135   RDW 14.4 10/02/2018 1156   LYMPHSABS 2.6 01/06/2024 0605   LYMPHSABS 4.2 (H) 10/04/2016 1500   MONOABS 1.3 (H) 01/06/2024 0605   EOSABS 0.5 01/06/2024 0605   EOSABS 0.2 10/04/2016 1500  BASOSABS 0.1 01/06/2024 0605   BASOSABS 0.0 10/04/2016 1500    CMP     Component Value Date/Time   NA 138 01/13/2024 1135   NA 145 (H) 10/02/2018 1156   K 4.8 01/13/2024 1135   CL 103 01/13/2024 1135   CO2 26 01/13/2024 1135   GLUCOSE 94 01/13/2024 1135   BUN 18 01/13/2024 1135   BUN 10 10/02/2018 1156   CREATININE 1.35 (H) 01/13/2024 1135   CREATININE 1.13 03/15/2016 1639   CALCIUM  9.4 01/13/2024 1135   PROT 7.3 01/13/2024 1135   ALBUMIN 3.6 01/13/2024 1135   AST 23 01/13/2024 1135   ALT 34 01/13/2024 1135   ALKPHOS 60 01/13/2024 1135   BILITOT 0.4 01/13/2024 1135   GFRNONAA >60 01/13/2024 1135   GFRNONAA 75 03/15/2016 1639   GFRAA 78 10/02/2018 1156   GFRAA 87 03/15/2016 1639       Assessment and Plan:   Jorge [name] is a 58 y.o. y/o male returns for hospital follow-up of gastric ulcers and chronic active H. pylori gastritis.  Treated with quadruple therapy 12/2023.  Gastric ulcers: Most likely due to NSAID use and H. pylori infection. - Scheduling EGD to verify healing of ulcers. I  discussed risks of EGD with patient to include risk of bleeding, perforation, and risk of sedation.  Patient expressed understanding and agrees to proceed with EGD.  - Advised him to avoid all OTC NSAIDs. - Okay to take Tylenol  if needed, max of 2000 mg daily.  Chronic active H. pylori gastritis: Treated 01/12/2024 with bismuth , omeprazole, tetracycline , metronidazole .  He completed all 14 days of treatment. - Ordered H. pylori stool test to ensure eradication.  GERD with LA grade B esophagitis: due to acute vomiting. Currently resolved / asymptomatic.  No longer on PPI. - If repeat EGD shows esophagitis, then recommend starting PPI such as pantoprazole . - Recommend Lifestyle Modifications to prevent Acid Reflux.  Rec. Avoid coffee, sodas, peppermint, garlic, onions, alcohol, citrus fruits, chocolate, tomatoes, fatty and spicey foods.  Avoid eating 2-3 hours before bedtime.    Hyperlipasemia: CT scan 12/2023 showed no pancreatitis or pancreas abnormality. - Lab today:  Lipase - Avoid alcohol.  History of alcohol-induced pancreatitis - Continue to avoid all alcohol.  6.  Colon cancer screening is up-to-date. - 10-year repeat screening colonoscopy will be due 11/2026.   Jorge Console, PA-C  Follow up as needed based on EGD results, test results, and GI symptoms.   "

## 2024-05-01 ENCOUNTER — Other Ambulatory Visit

## 2024-05-01 ENCOUNTER — Ambulatory Visit: Payer: Self-pay | Admitting: Physician Assistant

## 2024-05-01 ENCOUNTER — Encounter: Payer: Self-pay | Admitting: Physician Assistant

## 2024-05-01 ENCOUNTER — Ambulatory Visit (INDEPENDENT_AMBULATORY_CARE_PROVIDER_SITE_OTHER): Admitting: Physician Assistant

## 2024-05-01 VITALS — BP 120/66 | HR 64 | Ht 66.0 in | Wt 195.0 lb

## 2024-05-01 DIAGNOSIS — K259 Gastric ulcer, unspecified as acute or chronic, without hemorrhage or perforation: Secondary | ICD-10-CM | POA: Diagnosis not present

## 2024-05-01 DIAGNOSIS — R748 Abnormal levels of other serum enzymes: Secondary | ICD-10-CM

## 2024-05-01 DIAGNOSIS — Z8619 Personal history of other infectious and parasitic diseases: Secondary | ICD-10-CM

## 2024-05-01 LAB — LIPASE: Lipase: 35 U/L (ref 11.0–59.0)

## 2024-05-01 NOTE — Patient Instructions (Addendum)
 Your provider has requested that you go to the basement level for lab work before leaving today. Press B on the elevator. The lab is located at the first door on the left as you exit the elevator.  Please avoid all NSAID's. Some examples of NSAID's are as follows: Aspirin (Bufferin, Bayer, and Excedrin) Ibuprofen  (Advil , Motrin , Nuprin ) Ketoprofen (Actron, Orudis) Naproxen  (Aleve ) Daypro  Indocin  Lodine  Naprosyn   Relafen  Vimovo Voltaren  You have been scheduled for an Endoscopy. Please follow written instructions given to you at your visit today.  If you use inhalers (even only as needed), please bring them with you on the day of your procedure.  If you take any of the following medications, they will need to be adjusted prior to your procedure:   DO NOT TAKE 7 DAYS PRIOR TO TEST- Trulicity (dulaglutide) Ozempic, Wegovy (semaglutide) Mounjaro (tirzepatide) Bydureon Bcise (exanatide extended release)  DO NOT TAKE 1 DAY PRIOR TO YOUR TEST Rybelsus (semaglutide) Adlyxin (lixisenatide) Victoza (liraglutide) Byetta (exanatide) ___________________________________________________________________________  Please follow up sooner if symptoms increase or worsen  Due to recent changes in healthcare laws, you may see the results of your imaging and laboratory studies on MyChart before your provider has had a chance to review them.  We understand that in some cases there may be results that are confusing or concerning to you. Not all laboratory results come back in the same time frame and the provider may be waiting for multiple results in order to interpret others.  Please give us  48 hours in order for your provider to thoroughly review all the results before contacting the office for clarification of your results.   Thank you for trusting me with your gastrointestinal care!   [name], PA-C _______________________________________________________  If your blood pressure at  your visit was 140/90 or greater, please contact your primary care physician to follow up on this.  _______________________________________________________  If you are age 64 or older, your body mass index should be between 23-30. Your Body mass index is 31.47 kg/m. If this is out of the aforementioned range listed, please consider follow up with your Primary Care Provider.  If you are age 53 or younger, your body mass index should be between 19-25. Your Body mass index is 31.47 kg/m. If this is out of the aformentioned range listed, please consider follow up with your Primary Care Provider.   ________________________________________________________  The [facility] providers would like to encourage you to use MYCHART to communicate with providers for non-urgent requests or questions.  Due to long hold times on the telephone, sending your provider a message by [facility] may be a faster and more efficient way to get a response.  Please allow 48 business hours for a response.  Please remember that this is for non-urgent requests.  _______________________________________________________

## 2024-05-07 ENCOUNTER — Other Ambulatory Visit: Payer: Self-pay

## 2024-05-07 ENCOUNTER — Telehealth: Payer: Self-pay | Admitting: *Deleted

## 2024-05-07 DIAGNOSIS — K259 Gastric ulcer, unspecified as acute or chronic, without hemorrhage or perforation: Secondary | ICD-10-CM

## 2024-05-07 NOTE — Telephone Encounter (Signed)
 Dr. Albertus,  This pt is scheduled with you tomorrow.  He is a documented difficult intubation and his procedure will need to be done at the hospital.  I apologize for the late notice.  Best regards,  Norleen EMERSON Schillings

## 2024-05-07 NOTE — Telephone Encounter (Signed)
 Patient has been rescheduled at [facility] hospital on 06/18/24 at 10:45am. Scheduled with Luke, case ID #[id number]. Patient is aware of the appt and instructions have been mailed to patient.

## 2024-05-08 ENCOUNTER — Encounter: Admitting: Internal Medicine

## 2024-05-09 ENCOUNTER — Ambulatory Visit

## 2024-06-05 ENCOUNTER — Other Ambulatory Visit: Payer: Self-pay

## 2024-06-07 ENCOUNTER — Telehealth (INDEPENDENT_AMBULATORY_CARE_PROVIDER_SITE_OTHER): Payer: Self-pay | Admitting: Primary Care

## 2024-06-07 NOTE — Telephone Encounter (Signed)
 Called pt to confirm appt. Pt will be present.

## 2024-06-08 ENCOUNTER — Encounter (INDEPENDENT_AMBULATORY_CARE_PROVIDER_SITE_OTHER): Payer: Self-pay | Admitting: Primary Care

## 2024-06-08 ENCOUNTER — Other Ambulatory Visit: Payer: Self-pay

## 2024-06-08 ENCOUNTER — Ambulatory Visit (INDEPENDENT_AMBULATORY_CARE_PROVIDER_SITE_OTHER): Admitting: Primary Care

## 2024-06-08 VITALS — BP 150/92 | HR 88 | Resp 16 | Wt 201.4 lb

## 2024-06-08 DIAGNOSIS — E119 Type 2 diabetes mellitus without complications: Secondary | ICD-10-CM

## 2024-06-08 DIAGNOSIS — Z7984 Long term (current) use of oral hypoglycemic drugs: Secondary | ICD-10-CM | POA: Diagnosis not present

## 2024-06-08 DIAGNOSIS — I10 Essential (primary) hypertension: Secondary | ICD-10-CM

## 2024-06-08 DIAGNOSIS — E669 Obesity, unspecified: Secondary | ICD-10-CM

## 2024-06-08 DIAGNOSIS — Z6832 Body mass index (BMI) 32.0-32.9, adult: Secondary | ICD-10-CM | POA: Diagnosis not present

## 2024-06-08 DIAGNOSIS — Z1159 Encounter for screening for other viral diseases: Secondary | ICD-10-CM

## 2024-06-08 DIAGNOSIS — Z23 Encounter for immunization: Secondary | ICD-10-CM

## 2024-06-08 DIAGNOSIS — Z13220 Encounter for screening for lipoid disorders: Secondary | ICD-10-CM

## 2024-06-08 MED ORDER — AMLODIPINE BESYLATE 10 MG PO TABS
10.0000 mg | ORAL_TABLET | Freq: Every day | ORAL | 1 refills | Status: AC
  Filled 2024-06-08: qty 90, 90d supply, fill #0

## 2024-06-08 MED ORDER — METFORMIN HCL 500 MG PO TABS
500.0000 mg | ORAL_TABLET | Freq: Two times a day (BID) | ORAL | 1 refills | Status: AC
  Filled 2024-06-08: qty 180, 90d supply, fill #0
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
## 2024-06-09 LAB — CBC WITH DIFFERENTIAL/PLATELET
Basophils Absolute: 0 x10E3/uL (ref 0.0–0.2)
Basos: 0 %
EOS (ABSOLUTE): 0.2 x10E3/uL (ref 0.0–0.4)
Eos: 2 %
Hematocrit: 45.7 % (ref 37.5–51.0)
Hemoglobin: 15.2 g/dL (ref 13.0–17.7)
Immature Grans (Abs): 0 x10E3/uL (ref 0.0–0.1)
Immature Granulocytes: 0 %
Lymphocytes Absolute: 2.4 x10E3/uL (ref 0.7–3.1)
Lymphs: 25 %
MCH: 31 pg (ref 26.6–33.0)
MCHC: 33.3 g/dL (ref 31.5–35.7)
MCV: 93 fL (ref 79–97)
Monocytes Absolute: 0.7 x10E3/uL (ref 0.1–0.9)
Monocytes: 7 %
Neutrophils Absolute: 6.2 x10E3/uL (ref 1.4–7.0)
Neutrophils: 66 %
Platelets: 248 x10E3/uL (ref 150–450)
RBC: 4.91 x10E6/uL (ref 4.14–5.80)
RDW: 14.7 % (ref 11.6–15.4)
WBC: 9.4 x10E3/uL (ref 3.4–10.8)

## 2024-06-09 LAB — HCV AB W REFLEX TO QUANT PCR: HCV Ab: NONREACTIVE

## 2024-06-09 LAB — CMP14+EGFR
ALT: 23 IU/L (ref 0–44)
AST: 25 IU/L (ref 0–40)
Albumin: 4.7 g/dL (ref 3.8–4.9)
Alkaline Phosphatase: 65 IU/L (ref 47–123)
BUN/Creatinine Ratio: 15 (ref 9–20)
BUN: 15 mg/dL (ref 6–24)
Bilirubin Total: 0.4 mg/dL (ref 0.0–1.2)
CO2: 25 mmol/L (ref 20–29)
Calcium: 9.4 mg/dL (ref 8.7–10.2)
Chloride: 102 mmol/L (ref 96–106)
Creatinine, Ser: 1.01 mg/dL (ref 0.76–1.27)
Globulin, Total: 2.7 g/dL (ref 1.5–4.5)
Glucose: 150 mg/dL — ABNORMAL HIGH (ref 70–99)
Potassium: 4.2 mmol/L (ref 3.5–5.2)
Sodium: 140 mmol/L (ref 134–144)
Total Protein: 7.4 g/dL (ref 6.0–8.5)
eGFR: 86 mL/min/1.73 (ref >59)

## 2024-06-09 LAB — MICROALBUMIN / CREATININE URINE RATIO
Creatinine, Urine: 113.9 mg/dL
Microalb/Creat Ratio: 37 mg/g{creat} — ABNORMAL HIGH (ref 0–29)
Microalbumin, Urine: 42.5 ug/mL

## 2024-06-09 LAB — LIPID PANEL
Chol/HDL Ratio: 3.9 ratio (ref 0.0–5.0)
Cholesterol, Total: 209 mg/dL — ABNORMAL HIGH (ref 100–199)
HDL: 53 mg/dL (ref >39)
LDL Chol Calc (NIH): 139 mg/dL — ABNORMAL HIGH (ref 0–99)
Triglycerides: 93 mg/dL (ref 0–149)
VLDL Cholesterol Cal: 17 mg/dL (ref 5–40)

## 2024-06-09 LAB — HEMOGLOBIN A1C
Est. average glucose Bld gHb Est-mCnc: 134 mg/dL
Hgb A1c MFr Bld: 6.3 % — ABNORMAL HIGH (ref 4.8–5.6)

## 2024-06-09 LAB — HCV INTERPRETATION

## 2024-06-11 ENCOUNTER — Telehealth: Payer: Self-pay | Admitting: Gastroenterology

## 2024-06-11 NOTE — Telephone Encounter (Addendum)
 Procedure:Endoscopy Procedure date: 06/18/24 Procedure location: [facility] Arrival Time: 9:15 am Spoke with the patient Y/N: Yes Any prep concerns? No  Has the patient obtained the prep from the pharmacy ? No prep needed Do you have a care partner and transportation: Yes Any additional concerns? No

## 2024-06-12 ENCOUNTER — Encounter (HOSPITAL_COMMUNITY): Payer: Self-pay | Admitting: Internal Medicine

## 2024-06-12 NOTE — Progress Notes (Signed)
 Attempted to obtain medical history for pre op call via telephone, unable to reach at this time. HIPAA compliant voicemail message left requesting return call to pre surgical testing department.

## 2024-06-12 NOTE — Progress Notes (Signed)
 Jorge [name]     PCP-Edwards NP Cardiologist-n/a Pulmonologist- n/a  EKG-01/04/24 Echo-n/a Cath-n/a        Stress-n/a ICD/PM-n/a GLP1-n/a Blood Thinner-n/a  History: HTN, DM. He was hospitalized in July for pancreatitis, they did an EGD while inpt and had some complications. Started out MAC but then was difficult to sedate so turned General but they had difficult intubation and he had poor dentition so they knocked a few teeth out. I spoke with him he hasn't had any previous issues other than he said because of his OSA they watch him.    Anesthesia Review- Yes- just write that in your note, anes did place note from last encounter.

## 2024-06-18 ENCOUNTER — Ambulatory Visit (HOSPITAL_COMMUNITY)

## 2024-06-18 ENCOUNTER — Ambulatory Visit (HOSPITAL_COMMUNITY)
Admission: RE | Admit: 2024-06-18 | Discharge: 2024-06-18 | Disposition: A | Discharge status: Home / Self Care | Attending: Internal Medicine | Admitting: Internal Medicine

## 2024-06-18 ENCOUNTER — Other Ambulatory Visit: Payer: Self-pay

## 2024-06-18 ENCOUNTER — Encounter (HOSPITAL_COMMUNITY): Payer: Self-pay | Admitting: Internal Medicine

## 2024-06-18 ENCOUNTER — Encounter (HOSPITAL_COMMUNITY): Admission: RE | Payer: Self-pay

## 2024-06-18 DIAGNOSIS — Z8619 Personal history of other infectious and parasitic diseases: Secondary | ICD-10-CM

## 2024-06-18 DIAGNOSIS — K2950 Unspecified chronic gastritis without bleeding: Secondary | ICD-10-CM | POA: Insufficient documentation

## 2024-06-18 DIAGNOSIS — K2980 Duodenitis without bleeding: Secondary | ICD-10-CM

## 2024-06-18 DIAGNOSIS — Z59868 Other specified financial insecurity: Secondary | ICD-10-CM | POA: Diagnosis not present

## 2024-06-18 DIAGNOSIS — I10 Essential (primary) hypertension: Secondary | ICD-10-CM | POA: Insufficient documentation

## 2024-06-18 DIAGNOSIS — E119 Type 2 diabetes mellitus without complications: Secondary | ICD-10-CM | POA: Diagnosis not present

## 2024-06-18 DIAGNOSIS — Z833 Family history of diabetes mellitus: Secondary | ICD-10-CM | POA: Insufficient documentation

## 2024-06-18 DIAGNOSIS — Z8711 Personal history of peptic ulcer disease: Secondary | ICD-10-CM | POA: Diagnosis not present

## 2024-06-18 DIAGNOSIS — F17210 Nicotine dependence, cigarettes, uncomplicated: Secondary | ICD-10-CM | POA: Insufficient documentation

## 2024-06-18 DIAGNOSIS — K253 Acute gastric ulcer without hemorrhage or perforation: Secondary | ICD-10-CM | POA: Diagnosis present

## 2024-06-18 DIAGNOSIS — Z79899 Other long term (current) drug therapy: Secondary | ICD-10-CM | POA: Diagnosis not present

## 2024-06-18 DIAGNOSIS — Z7984 Long term (current) use of oral hypoglycemic drugs: Secondary | ICD-10-CM | POA: Insufficient documentation

## 2024-06-18 DIAGNOSIS — B9681 Helicobacter pylori [H. pylori] as the cause of diseases classified elsewhere: Secondary | ICD-10-CM | POA: Diagnosis present

## 2024-06-18 HISTORY — PX: ESOPHAGOGASTRODUODENOSCOPY: SHX5428

## 2024-06-18 HISTORY — DX: Failed or difficult intubation, initial encounter: T88.4XXA

## 2024-06-18 HISTORY — DX: Sleep apnea, unspecified: G47.30

## 2024-06-18 HISTORY — DX: Other complications of anesthesia, initial encounter: T88.59XA

## 2024-06-18 HISTORY — PX: BONE BIOPSY: SHX375

## 2024-06-18 LAB — GLUCOSE, CAPILLARY
Glucose-Capillary: 106 mg/dL — ABNORMAL HIGH (ref 70–99)
Glucose-Capillary: 108 mg/dL — ABNORMAL HIGH (ref 70–99)

## 2024-06-18 SURGERY — EGD (ESOPHAGOGASTRODUODENOSCOPY)
Anesthesia: Monitor Anesthesia Care

## 2024-06-18 MED ORDER — PROPOFOL 500 MG/50ML IV EMUL
INTRAVENOUS | Status: AC
  Filled 2024-06-18: qty 50

## 2024-06-18 MED ORDER — MIDAZOLAM HCL 2 MG/2ML IJ SOLN
INTRAMUSCULAR | Status: AC
  Filled 2024-06-18: qty 2

## 2024-06-18 MED ORDER — MIDAZOLAM HCL 5 MG/5ML IJ SOLN: INTRAMUSCULAR | Status: DC | PRN

## 2024-06-18 MED ORDER — PROPOFOL 500 MG/50ML IV EMUL
INTRAVENOUS | Status: DC | PRN
  Administered 2024-06-18: 175 ug/kg/min via INTRAVENOUS
  Administered 2024-06-18: 30 mg via INTRAVENOUS

## 2024-06-18 MED ORDER — DEXAMETHASONE SODIUM PHOSPHATE 4 MG/ML IJ SOLN
INTRAMUSCULAR | Status: DC | PRN
  Administered 2024-06-18: 4 mg via INTRAVENOUS

## 2024-06-18 MED ORDER — SODIUM CHLORIDE 0.9 % IV SOLN
INTRAVENOUS | Status: AC | PRN
  Administered 2024-06-18: 1000 mL via INTRAVENOUS

## 2024-06-18 MED ORDER — PROPOFOL 1000 MG/100ML IV EMUL
INTRAVENOUS | Status: AC
  Filled 2024-06-18: qty 100

## 2024-06-18 MED ORDER — ONDANSETRON HCL 4 MG/2ML IJ SOLN
INTRAMUSCULAR | Status: DC | PRN
  Administered 2024-06-18: 4 mg via INTRAVENOUS

## 2024-06-18 MED ORDER — LIDOCAINE 2% (20 MG/ML) 5 ML SYRINGE
INTRAMUSCULAR | Status: DC | PRN
  Administered 2024-06-18: 40 mg via INTRAVENOUS
  Administered 2024-06-18: 60 mg via INTRAVENOUS

## 2024-06-18 MED ORDER — PROPOFOL 10 MG/ML IV BOLUS
INTRAVENOUS | Status: DC | PRN
  Administered 2024-06-18: 50 mg via INTRAVENOUS
  Administered 2024-06-18: 100 mg via INTRAVENOUS

## 2024-06-18 MED ORDER — MIDAZOLAM HCL (PF) 2 MG/2ML IJ SOLN
INTRAMUSCULAR | Status: DC | PRN
  Administered 2024-06-18 (×2): 1 mg via INTRAVENOUS

## 2024-06-18 MED ORDER — PHENYLEPHRINE 80 MCG/ML (10ML) SYRINGE FOR IV PUSH (FOR BLOOD PRESSURE SUPPORT)
PREFILLED_SYRINGE | INTRAVENOUS | Status: DC | PRN
  Administered 2024-06-18: 80 ug via INTRAVENOUS

## 2024-06-18 MED ORDER — SUCCINYLCHOLINE CHLORIDE 200 MG/10ML IV SOSY
PREFILLED_SYRINGE | INTRAVENOUS | Status: DC | PRN
  Administered 2024-06-18: 100 mg via INTRAVENOUS

## 2024-06-18 NOTE — Anesthesia Procedure Notes (Signed)
 Procedure Name: Intubation Date/Time: 06/18/2024 10:59 AM  Performed by: Augusta Daved SAILOR, CRNAPre-anesthesia Checklist: Patient identified, Emergency Drugs available, Suction available and Patient being monitored Patient Re-evaluated:Patient Re-evaluated prior to induction Oxygen Delivery Method: Circle System Utilized Preoxygenation: Pre-oxygenation with 100% oxygen Induction Type: IV induction Laryngoscope Size: Glidescope and 3 Grade View: Grade I Tube type: Oral Number of attempts: 1 Airway Equipment and Method: Stylet and Oral airway Placement Confirmation: ETT inserted through vocal cords under direct vision, positive ETCO2 and breath sounds checked- equal and bilateral Tube secured with: Tape Dental Injury: Teeth and Oropharynx as per pre-operative assessment

## 2024-06-18 NOTE — Anesthesia Postprocedure Evaluation (Signed)
"   Anesthesia Post Note  Patient: Jorge [name]  Procedure(s) Performed: EGD (ESOPHAGOGASTRODUODENOSCOPY) BIOPSY, GI     Patient location during evaluation: PACU Anesthesia Type: MAC Level of consciousness: awake Pain management: pain level controlled Vital Signs Assessment: post-procedure vital signs reviewed and stable Respiratory status: spontaneous breathing Cardiovascular status: blood pressure returned to baseline Postop Assessment: no apparent nausea or vomiting Anesthetic complications: no   No notable events documented.  Last Vitals:  Vitals:   06/18/24 1200 06/18/24 1210  BP: (!) 145/89 122/63  Pulse: 85 82  Resp: 20 19  Temp:    SpO2: 96% 98%    Last Pain:  Vitals:   06/18/24 1210  TempSrc:   PainSc: 0-No pain                 Sera Hitsman T Colhoun      "

## 2024-06-18 NOTE — Transfer of Care (Signed)
 Immediate Anesthesia Transfer of Care Note  Patient: Jorge [name]  Procedure(s) Performed: EGD (ESOPHAGOGASTRODUODENOSCOPY) BIOPSY, GI  Patient Location: PACU  Anesthesia Type:MAC  Level of Consciousness: drowsy  Airway & Oxygen Therapy: Patient Spontanous Breathing and Patient connected to face mask oxygen  Post-op Assessment: Report given to RN and Post -op Vital signs reviewed and stable  Post vital signs: Reviewed and stable  Last Vitals:  Vitals Value Taken Time  BP 147/69 06/18/24 11:30  Temp    Pulse 86 06/18/24 11:32  Resp 28 06/18/24 11:32  SpO2 99 % 06/18/24 11:32  Vitals shown include unfiled device data.  Last Pain:  Vitals:   06/18/24 0937  TempSrc: Temporal  PainSc: 0-No pain         Complications: No notable events documented.

## 2024-06-18 NOTE — Anesthesia Preprocedure Evaluation (Signed)
"                                    Anesthesia Evaluation  Patient identified by MRN, date of birth, ID band Patient awake    Reviewed: Allergy & Precautions, NPO status , Patient's Chart, lab work & pertinent test results  History of Anesthesia Complications (+) DIFFICULT AIRWAY and history of anesthetic complications  Airway Mallampati: II  TM Distance: >3 FB Neck ROM: Full    Dental  (+) Teeth Intact, Dental Advisory Given   Pulmonary Current Smoker and Patient abstained from smoking.   breath sounds clear to auscultation       Cardiovascular hypertension, Pt. on medications  Rhythm:Regular Rate:Normal     Neuro/Psych    GI/Hepatic PUD,,,(+)     substance abuse  marijuana use  Endo/Other  diabetes, Type 2    Renal/GU Renal disease     Musculoskeletal   Abdominal   Peds  Hematology Hgb 15.2, Plts 258K (06/08/24)   Anesthesia Other Findings   Reproductive/Obstetrics                              Anesthesia Physical Anesthesia Plan  ASA: 2  Anesthesia Plan: MAC   Post-op Pain Management:    Induction: Intravenous  PONV Risk Score and Plan: 1 and Ondansetron , Propofol  infusion and Treatment may vary due to age or medical condition  Airway Management Planned: Natural Airway and Simple Face Mask  Additional Equipment: None  Intra-op Plan:   Post-operative Plan: Extubation in OR  Informed Consent: I have reviewed the patients History and Physical, chart, labs and discussed the procedure including the risks, benefits and alternatives for the proposed anesthesia with the patient or authorized representative who has indicated his/her understanding and acceptance.     Dental advisory given  Plan Discussed with: CRNA  Anesthesia Plan Comments:          Anesthesia Quick Evaluation  "

## 2024-06-18 NOTE — Discharge Instructions (Signed)
 YOU HAD AN ENDOSCOPIC PROCEDURE TODAY: Refer to the procedure report and other information in the discharge instructions given to you for any specific questions about what was found during the examination. If this information does not answer your questions, please call [facility] office at [phone] to clarify.   YOU SHOULD EXPECT: Some feelings of bloating in the abdomen. Passage of more gas than usual. Walking can help get rid of the air that was put into your GI tract during the procedure and reduce the bloating. If you had a lower endoscopy (such as a colonoscopy or flexible sigmoidoscopy) you may notice spotting of blood in your stool or on the toilet paper. Some abdominal soreness may be present for a day or two, also.  DIET: Your first meal following the procedure should be a light meal and then it is ok to progress to your normal diet. A half-sandwich or bowl of soup is an example of a good first meal. Heavy or fried foods are harder to digest and may make you feel nauseous or bloated. Drink plenty of fluids but you should avoid alcoholic beverages for 24 hours    ACTIVITY: Your care partner should take you home directly after the procedure. You should plan to take it easy, moving slowly for the rest of the day. You can resume normal activity the day after the procedure however YOU SHOULD NOT DRIVE, use power tools, machinery or perform tasks that involve climbing or major physical exertion for 24 hours (because of the sedation medicines used during the test).   SYMPTOMS TO REPORT IMMEDIATELY: A gastroenterologist can be reached at any hour. Please call [phone]  for any of the following symptoms:    Following upper endoscopy (EGD, EUS, ERCP, esophageal dilation) Vomiting of blood or coffee ground material  New, significant abdominal pain  New, significant chest pain or pain under the shoulder blades  Painful or persistently difficult swallowing  New shortness of breath  Black,  tarry-looking or red, bloody stools  FOLLOW UP:  If any biopsies were taken you will be contacted by phone or by letter within the next 1-3 weeks. Call [phone]  if you have not heard about the biopsies in 3 weeks.  Please also call with any specific questions about appointments or follow up tests.

## 2024-06-18 NOTE — H&P (Signed)
 "   GASTROENTEROLOGY PROCEDURE H&P NOTE   Primary Care Physician: Celestia Rosaline SQUIBB, NP    Reason for Procedure:  Follow-up multiple gastric ulcers and H. pylori gastritis  Plan:    EGD  Patient is appropriate for endoscopic procedure(s) in the outpatient hospital setting.  The nature of the procedure, as well as the risks, benefits, and alternatives were carefully and thoroughly reviewed with the patient. Ample time for discussion and questions allowed.  All questions were answered. The patient understood, was satisfied, and agreed with the plan to proceed.    HPI: Jorge [name] is a 58 y.o. male who presents for EGD.  Medical history as below.   No recent chest pain or shortness of breath.  No abdominal pain today.  Past Medical History:  Diagnosis Date   Allergy    Complication of anesthesia    Diabetes mellitus without complication (HCC)    Difficult intubation    Environmental allergies    Hypertension    Sinus infection    Sleep apnea     Past Surgical History:  Procedure Laterality Date   ESOPHAGOGASTRODUODENOSCOPY N/A 01/05/2024   Procedure: EGD (ESOPHAGOGASTRODUODENOSCOPY);  Surgeon: [name], MD;  Location: [facility] ENDOSCOPY;  Service: Gastroenterology;  Laterality: N/A;   NO PAST SURGERIES      Prior to Admission medications  Medication Sig Start Date End Date Taking? Authorizing Provider  atorvastatin  (LIPITOR) 40 MG tablet Take 1 tablet (40 mg total) by mouth daily. 01/24/24  Yes Celestia Rosaline SQUIBB, NP  fluticasone  (FLONASE ) 50 MCG/ACT nasal spray Place 1 spray into both nostrils daily. 01/24/24  Yes Celestia Rosaline SQUIBB, NP  metFORMIN  (GLUCOPHAGE ) 500 MG tablet Take 1 tablet (500 mg total) by mouth 2 (two) times daily with a meal. 06/08/24  Yes Celestia Rosaline SQUIBB, NP  tadalafil  (CIALIS ) 5 MG tablet Take 1 tablet (5 mg total) by mouth daily. 03/09/24  Yes Celestia Rosaline SQUIBB, NP  amLODipine  (NORVASC ) 10 MG tablet Take 1 tablet (10 mg total) by mouth  daily. 06/08/24   Celestia Rosaline SQUIBB, NP  azelastine  (ASTELIN ) 0.1 % nasal spray Place 2 sprays into both nostrils 2 (two) times daily for 30 days. Use in each nostril as directed 09/06/18 05/01/24  Fleming, [name], NP  Bismuth /Metronidaz/Tetracyclin (PYLERA ) 140-125-125 MG CAPS Take as directed per package. 01/24/24   Celestia Rosaline SQUIBB, NP  cetirizine  (ZYRTEC  ALLERGY) 10 MG tablet Take 1 tablet (10 mg total) by mouth daily. 01/24/24   Celestia Rosaline SQUIBB, NP  Chlorpheniramine Maleate (ALLERGY RELIEF PO) Take 1 tablet by mouth daily as needed (allergies).    [provider]  docusate sodium  (COLACE) 100 MG capsule Take 1 capsule (100 mg total) by mouth 2 (two) times daily. Patient not taking: Reported on 06/18/2024 01/06/24   [name], DO  folic acid  (FOLVITE ) 1 MG tablet Take 1 tablet (1 mg total) by mouth daily. 01/24/24   Celestia Rosaline SQUIBB, NP  ondansetron  (ZOFRAN ) 4 MG tablet Take 1 tablet (4 mg total) by mouth every 6 (six) hours as needed for nausea. 01/06/24   [name], DO  thiamine  (VITAMIN B1) 100 MG tablet Take 1 tablet (100 mg total) by mouth daily. 01/24/24   Celestia Rosaline SQUIBB, NP    Current Facility-Administered Medications  Medication Dose Route Frequency Provider Last Rate Last Admin   0.9 %  sodium chloride  infusion    Continuous PRN Albertus Gordy HERO, MD 20 mL/hr at 06/18/24 0959 1,000 mL at 06/18/24  9040    Allergies as of 05/07/2024 - Review Complete 05/01/2024  Allergen Reaction Noted   Other Anaphylaxis 11/24/2016   Peanut-containing drug products Anaphylaxis 11/24/2016    Family History  Problem Relation Age of Onset   Ovarian cancer Mother    Diabetes Father    Colon cancer Neg Hx     Social History   Socioeconomic History   Marital status: Single    Spouse name: Not on file   Number of children: Not on file   Years of education: Not on file   Highest education level: Not on file  Occupational History   Not on file  Tobacco Use    Smoking status: Every Day    Current packs/day: 0.50    Types: Cigarettes   Smokeless tobacco: Never  Vaping Use   Vaping status: Never Used  Substance and Sexual Activity   Alcohol use: Not Currently   Drug use: Yes    Types: Marijuana    Comment: daily per pt last 12/05/16   Sexual activity: Yes  Other Topics Concern   Not on file  Social History Narrative   Not on file   Social Drivers of Health   Tobacco Use: High Risk (06/18/2024)   Patient History    Smoking Tobacco Use: Every Day    Smokeless Tobacco Use: Never    Passive Exposure: Not on file  Financial Resource Strain: Medium Risk (02/20/2024)   Overall Financial Resource Strain (CARDIA)    Difficulty of Paying Living Expenses: Somewhat hard  Food Insecurity: No Food Insecurity (01/05/2024)   Epic    Worried About Programme Researcher, Broadcasting/film/video in the Last Year: Never true    Ran Out of Food in the Last Year: Never true  Transportation Needs: No Transportation Needs (01/05/2024)   Epic    Lack of Transportation (Medical): No    Lack of Transportation (Non-Medical): No  Physical Activity: Inactive (02/20/2024)   Exercise Vital Sign    Days of Exercise per Week: 0 days    Minutes of Exercise per Session: 0 min  Stress: No Stress Concern Present (02/20/2024)   [employer] of Occupational Health - Occupational Stress Questionnaire    Feeling of Stress: Not at all  Social Connections: Socially Integrated (02/20/2024)   Social Connection and Isolation Panel    Frequency of Communication with Friends and Family: More than three times a week    Frequency of Social Gatherings with Friends and Family: More than three times a week    Attends Religious Services: More than 4 times per year    Active Member of Clubs or Organizations: Yes    Attends Banker Meetings: More than 4 times per year    Marital Status: Married  Catering Manager Violence: Not At Risk (01/05/2024)   Epic    Fear of Current or Ex-Partner: No     Emotionally Abused: No    Physically Abused: No    Sexually Abused: No  Depression (PHQ2-9): Low Risk (06/08/2024)   Depression (PHQ2-9)    PHQ-2 Score: 0  Alcohol Screen: Low Risk (02/20/2024)   Alcohol Screen    Last Alcohol Screening Score (AUDIT): 0  Housing: Low Risk (01/05/2024)   Epic    Unable to Pay for Housing in the Last Year: No    Number of Times Moved in the Last Year: 0    Homeless in the Last Year: No  Utilities: Not At Risk (01/05/2024)   Epic  Threatened with loss of utilities: No  Health Literacy: Not on file    Physical Exam: Vital signs in last 24 hours: @BP  (!) 131/95   Pulse 88   Temp 98.1 F (36.7 C) (Temporal)   Resp 15   Ht 5' 6 (1.676 m)   Wt 90.7 kg   SpO2 97%   BMI 32.28 kg/m  GEN: NAD EYE: Sclerae anicteric ENT: MMM CV: Non-tachycardic Pulm: CTA b/l GI: Soft, NT/ND NEURO:  Alert & Oriented x 3   Gordy Starch, MD [facility]  06/18/2024 10:30 AM  "

## 2024-06-18 NOTE — Op Note (Signed)
 [facility] Patient Name: Jorge [name] Procedure Date: 06/18/2024 MRN: [id number] Attending MD: Gordy CHRISTELLA Starch , MD, [id number] Date of Birth: 09/13/1965 CSN: [id number] Age: 58 Admit Type: Outpatient Procedure:                Upper GI endoscopy Indications:              Previously treated for Helicobacter pylori and                            follow-up of acute gastric ulcers seen at EGD in                            July 2025 Providers:                Gordy CHRISTELLA. Starch, MD, Collene Edu, RN, Curtistine Bishop,                            Technician Referring MD:             Rosaline MYRTIS Bohr Medicines:                Monitored Anesthesia Care Complications:            No immediate complications. Estimated Blood Loss:     Estimated blood loss was minimal. Procedure:                Pre-Anesthesia Assessment:                           - Prior to the procedure, a History and Physical                            was performed, and patient medications and                            allergies were reviewed. The patient's tolerance of                            previous anesthesia was also reviewed. The risks                            and benefits of the procedure and the sedation                            options and risks were discussed with the patient.                            All questions were answered, and informed consent                            was obtained. Prior Anticoagulants: The patient has                            taken no anticoagulant or antiplatelet agents. ASA  Grade Assessment: III - A patient with severe                            systemic disease. After reviewing the risks and                            benefits, the patient was deemed in satisfactory                            condition to undergo the procedure.                           After obtaining informed consent, the endoscope was                            passed  under direct vision. Throughout the                            procedure, the patient's blood pressure, pulse, and                            oxygen saturations were monitored continuously. The                            [id number]-[id number] ([id number]) Olympus endoscope was introduced                            through the mouth, and advanced to the second part                            of duodenum. The upper GI endoscopy was                            accomplished without difficulty. The patient                            tolerated the procedure well. Scope In: Scope Out: Findings:      The examined esophagus was normal.      The entire examined stomach was normal. Biopsies were taken with a cold       forceps for Helicobacter pylori testing.      Diffuse mild inflammation characterized by congestion (edema) and       erythema was found in the duodenal bulb.      The second portion of the duodenum was normal. Impression:               - Normal esophagus.                           - Normal stomach. Gastric ulcers have healed.                            Biopsied to ensure eradication of H. Pylori  infection.                           - Duodenitis in the bulb, likely peptic in nature.                           - Normal second portion of the duodenum. Moderate Sedation:      N/A Recommendation:           - Patient has a contact number available for                            emergencies. The signs and symptoms of potential                            delayed complications were discussed with the                            patient. Return to normal activities tomorrow.                            Written discharge instructions were provided to the                            patient.                           - Resume previous diet.                           - Continue present medications.                           - Await pathology results. Procedure Code(s):        ---  Professional ---                           [phone], Esophagogastroduodenoscopy, flexible,                            transoral; with biopsy, single or multiple Diagnosis Code(s):        --- Professional ---                           K29.80, Duodenitis without bleeding                           K25.3, Acute gastric ulcer without hemorrhage or                            perforation CPT copyright 2022 American Medical Association. All rights reserved. The codes documented in this report are preliminary and upon coder review may  be revised to meet current compliance requirements. Gordy CHRISTELLA Starch, MD 06/18/2024 11:17:47 AM This report has been signed electronically. Number of Addenda: 0

## 2024-06-19 ENCOUNTER — Encounter (HOSPITAL_COMMUNITY): Payer: Self-pay | Admitting: Internal Medicine

## 2024-06-22 ENCOUNTER — Other Ambulatory Visit: Payer: Self-pay

## 2024-06-22 ENCOUNTER — Ambulatory Visit (INDEPENDENT_AMBULATORY_CARE_PROVIDER_SITE_OTHER): Payer: Self-pay | Admitting: Primary Care

## 2024-06-22 LAB — SURGICAL PATHOLOGY

## 2024-06-22 MED ORDER — ATORVASTATIN CALCIUM 40 MG PO TABS
40.0000 mg | ORAL_TABLET | Freq: Every day | ORAL | 1 refills | Status: AC
  Filled 2024-06-22: qty 90, 90d supply, fill #0

## 2024-06-25 ENCOUNTER — Ambulatory Visit: Payer: Self-pay | Admitting: Internal Medicine

## 2024-07-03 ENCOUNTER — Other Ambulatory Visit: Payer: Self-pay
